# Patient Record
Sex: Female | Born: 1995
Health system: Southern US, Community
[De-identification: ages and names within clinical notes are randomized; demographics above are authoritative.]

## PROBLEM LIST (undated history)

## (undated) DIAGNOSIS — R55 Syncope and collapse: Secondary | ICD-10-CM

## (undated) DIAGNOSIS — I498 Other specified cardiac arrhythmias: Secondary | ICD-10-CM

## (undated) DIAGNOSIS — I951 Orthostatic hypotension: Secondary | ICD-10-CM

## (undated) HISTORY — DX: Syncope and collapse: R55

## (undated) HISTORY — DX: Orthostatic hypotension: I95.1

## (undated) HISTORY — PX: TONSILLECTOMY: SUR1361

## (undated) HISTORY — DX: Other specified cardiac arrhythmias: I49.8

---
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
## 2015-01-01 ENCOUNTER — Encounter (HOSPITAL_COMMUNITY): Payer: Self-pay | Admitting: *Deleted

## 2015-01-01 ENCOUNTER — Emergency Department (HOSPITAL_COMMUNITY)
Admission: EM | Admit: 2015-01-01 | Discharge: 2015-01-01 | Disposition: A | Discharge status: Home / Self Care | Payer: BLUE CROSS/BLUE SHIELD | Attending: Emergency Medicine | Admitting: Emergency Medicine

## 2015-01-01 ENCOUNTER — Emergency Department (HOSPITAL_COMMUNITY): Payer: BLUE CROSS/BLUE SHIELD

## 2015-01-01 DIAGNOSIS — S060X1A Concussion with loss of consciousness of 30 minutes or less, initial encounter: Secondary | ICD-10-CM

## 2015-01-01 DIAGNOSIS — S0990XA Unspecified injury of head, initial encounter: Secondary | ICD-10-CM | POA: Diagnosis present

## 2015-01-01 DIAGNOSIS — Y93E1 Activity, personal bathing and showering: Secondary | ICD-10-CM | POA: Insufficient documentation

## 2015-01-01 DIAGNOSIS — Y998 Other external cause status: Secondary | ICD-10-CM | POA: Insufficient documentation

## 2015-01-01 DIAGNOSIS — W182XXA Fall in (into) shower or empty bathtub, initial encounter: Secondary | ICD-10-CM | POA: Insufficient documentation

## 2015-01-01 DIAGNOSIS — Y92002 Bathroom of unspecified non-institutional (private) residence single-family (private) house as the place of occurrence of the external cause: Secondary | ICD-10-CM | POA: Insufficient documentation

## 2015-01-01 DIAGNOSIS — R011 Cardiac murmur, unspecified: Secondary | ICD-10-CM | POA: Diagnosis not present

## 2015-01-01 DIAGNOSIS — R55 Syncope and collapse: Secondary | ICD-10-CM | POA: Insufficient documentation

## 2015-01-01 DIAGNOSIS — Z3202 Encounter for pregnancy test, result negative: Secondary | ICD-10-CM | POA: Insufficient documentation

## 2015-01-01 DIAGNOSIS — Z792 Long term (current) use of antibiotics: Secondary | ICD-10-CM | POA: Diagnosis not present

## 2015-01-01 LAB — CBC
HEMATOCRIT: 39.5 % (ref 36.0–46.0)
Hemoglobin: 13.4 g/dL (ref 12.0–15.0)
MCH: 31.2 pg (ref 26.0–34.0)
MCHC: 33.9 g/dL (ref 30.0–36.0)
MCV: 91.9 fL (ref 78.0–100.0)
PLATELETS: 235 10*3/uL (ref 150–400)
RBC: 4.3 MIL/uL (ref 3.87–5.11)
RDW: 12.5 % (ref 11.5–15.5)
WBC: 5.9 10*3/uL (ref 4.0–10.5)

## 2015-01-01 LAB — URINALYSIS, ROUTINE W REFLEX MICROSCOPIC
BILIRUBIN URINE: NEGATIVE
GLUCOSE, UA: NEGATIVE mg/dL
KETONES UR: NEGATIVE mg/dL
NITRITE: NEGATIVE
PH: 6 (ref 5.0–8.0)
PROTEIN: NEGATIVE mg/dL
SPECIFIC GRAVITY, URINE: 1.029 (ref 1.005–1.030)
UROBILINOGEN UA: 0.2 mg/dL (ref 0.0–1.0)

## 2015-01-01 LAB — BASIC METABOLIC PANEL
ANION GAP: 9 (ref 5–15)
BUN: 15 mg/dL (ref 6–20)
CALCIUM: 9.3 mg/dL (ref 8.9–10.3)
CHLORIDE: 104 mmol/L (ref 101–111)
CO2: 25 mmol/L (ref 22–32)
Creatinine, Ser: 0.72 mg/dL (ref 0.44–1.00)
GFR calc Af Amer: >60 mL/min (ref >60)
GFR calc non Af Amer: >60 mL/min (ref >60)
GLUCOSE: 90 mg/dL (ref 65–99)
Potassium: 3.7 mmol/L (ref 3.5–5.1)
SODIUM: 138 mmol/L (ref 135–145)

## 2015-01-01 LAB — I-STAT BETA HCG BLOOD, ED (MC, WL, AP ONLY)

## 2015-01-01 LAB — URINE MICROSCOPIC-ADD ON

## 2015-01-01 MED ORDER — SODIUM CHLORIDE 0.9 % IV BOLUS (SEPSIS): 1000.0000 mL | Freq: Once | INTRAVENOUS | Status: DC

## 2015-01-01 NOTE — ED Notes (Signed)
Pt states she had a syncopal episode 2 days ago after taking a shower. Pt states she woke up with pain and a knot in the back of her head. Pt states she had syncopal episodes in the past but was never evaluated for them. Pt complains of dizziness, nausea, headache since the fall.

## 2015-01-01 NOTE — ED Notes (Signed)
Awake. Verbally responsive. A/O x4. Resp even and unlabored. No audible adventitious breath sounds noted. ABC's intact.  

## 2015-01-01 NOTE — ED Provider Notes (Signed)
CSN: [id number]     Arrival date & time 01/01/15  1310 History   First MD Initiated Contact with Patient 01/01/15 1402     Chief Complaint  Patient presents with  . Fall  . Head Injury  . Loss of Consciousness  . Dizziness     (Consider location/radiation/quality/duration/timing/severity/associated sxs/prior Treatment) HPI   Patient presents with residual headache after falling into the bathtub after syncopal episode two days ago.  States she was getting out of a hot shower, started feeling lightheaded and felt her heart start racing.  She then woke up in the bathtub with her father in the room with her.  States she remembers the sensation before passing out and remembers waking up, but doesn't remember actually taking the shower.  States she has had a throbbing headache since then that is intermittent, improved with ibuprofen.  She has had some lightheadedness with standing as well.  Denies fevers, neck pain or stiffness, other pain or injury, visual changes, vomiting, other focal neurologic deficits.  She has not at any time had any CP or SOB. Denies any other injury or concerns at this time. She takes doxycycline for acne but not other medications or medication changes.  Denies ETOH or drug use.  LMP last week, denies possibility of pregnancy.     History reviewed. No pertinent past medical history. Past Surgical History  Procedure Laterality Date  . Tonsillectomy     No family history on file. Social History  Substance Use Topics  . Smoking status: Never Smoker   . Smokeless tobacco: None  . Alcohol Use: No   OB History    No data available     Review of Systems  All other systems reviewed and are negative.     Allergies  Review of patient's allergies indicates not on file.  Home Medications   Prior to Admission medications   Medication Sig Start Date End Date Taking? Authorizing Provider  ciprofloxacin (CIPRO) 500 MG tablet Take 1 tablet (500 mg total) by mouth 2  (two) times daily. 08/05/12   Judyann Munson, MD   BP 141/64 mmHg  Pulse 106  Temp(Src) 98.6 F (37 C) (Oral)  Resp 18  SpO2 100%  LMP 12/25/2014 Physical Exam  Constitutional: She appears well-developed and well-nourished. No distress.  HENT:  Head: Normocephalic and atraumatic.  Eyes: Conjunctivae and EOM are normal. Pupils are equal, round, and reactive to light.  Neck: Normal range of motion. Neck supple.  Cardiovascular: Normal rate and regular rhythm.   Murmur (systolic) heard. Pulmonary/Chest: Effort normal and breath sounds normal. No respiratory distress. She has no wheezes. She has no rales.  Abdominal: Soft. She exhibits no distension. There is no tenderness. There is no rebound and no guarding.  Neurological: She is alert.  CN II-XII intact, EOMs intact, no pronator drift, grip strengths equal bilaterally; strength 5/5 in all extremities, sensation intact in all extremities; finger to nose, heel to shin, rapid alternating movements normal; gait is normal.     Skin: She is not diaphoretic.  Psychiatric: She has a normal mood and affect. Her behavior is normal. Thought content normal.  Nursing note and vitals reviewed.   ED Course  Procedures (including critical care time) Labs Review Labs Reviewed  URINALYSIS, ROUTINE W REFLEX MICROSCOPIC (NOT AT [facility]) - Abnormal; Notable for the following:    APPearance CLOUDY (*)    Hgb urine dipstick MODERATE (*)    Leukocytes, UA SMALL (*)    All other components  within normal limits  URINE MICROSCOPIC-ADD ON - Abnormal; Notable for the following:    Squamous Epithelial / LPF FEW (*)    Bacteria, UA FEW (*)    All other components within normal limits  BASIC METABOLIC PANEL  CBC  I-STAT BETA HCG BLOOD, ED ([facility], [facility], [facility] ONLY)    Imaging Review No results found. I, Carnisha Feltz, personally reviewed and evaluated these images and lab results as part of my medical decision-making.   EKG Interpretation   Date/Time:  Saturday  January 01 2015 13:40:49 EDT Ventricular Rate:  89 PR Interval:  132 QRS Duration: 100 QT Interval:  368 QTC Calculation: 448 R Axis:   66 Text Interpretation:  Sinus rhythm RSR' in V1 or V2, right VCD or RVH  Confirmed by ALLEN  MD, ANTHONY ([id number]) on 01/01/2015 3:37:30 PM       3:43 PM  Discussed pt with Dr Freida Busman who suggests pt should have a Head CT.  Will add head CT.  Patient and her mother agree to this.    MDM   Final diagnoses:  Syncope, unspecified syncope type  Concussion, with loss of consciousness of 30 minutes or less, initial encounter  Heart murmur    Afebrile, nontoxic patient with episode of syncope two days ago with memory loss around the event, also with intermittent headache since that time.  No neurologic deficits.  Workup remarkable only for noted murmur on physical exam that patient is unaware if she had previously.  Discussed with Dr Freida Busman who recommends Head CT and agrees with outpatient cardiology follow up.   Anticipate d/c home if Head CT negative.  Discussed pt with  Fayrene Helper, PA-C  Who assumes care pending final results.     Trixie Dredge, PA-C 01/01/15 1612  Lorre Nick, MD 01/02/15 0830

## 2015-01-01 NOTE — ED Provider Notes (Signed)
Physical Exam  BP 107/64 mmHg  Pulse 78  Temp(Src) 98.6 F (37 C) (Oral)  Resp 20  SpO2 100%  LMP 12/25/2014  Physical Exam  Constitutional: She is oriented to person, place, and time. She appears well-developed and well-nourished. No distress.  HENT:  Head: Normocephalic and atraumatic.  Eyes: Conjunctivae are normal.  Neck: Neck supple.  Neurological: She is alert and oriented to person, place, and time. She has normal strength. No cranial nerve deficit or sensory deficit. She displays a negative Romberg sign. Coordination and gait normal. GCS eye subscore is 4. GCS verbal subscore is 5. GCS motor subscore is 6.  Skin: No rash noted.  Psychiatric: She has a normal mood and affect.  Nursing note and vitals reviewed.   ED Course  Procedures  MDM Received signout at change of shift. Patient fell in the bathroom 2 days ago here for evaluation of headache. Head CT scan unremarkable. Patient has no focal neuro deficit on exam. Her pain is well controlled. She is able to ambulate without difficulty. She does have some symptoms of memory loss from the fall consistence with concussion. Concussion precautions discussed. UA shows blood and urine dipsticks likely secondary to recent menstrual period. She denies any urinary symptoms or having flank or abdominal pain. Patient stable for discharge.  BP 107/64 mmHg  Pulse 78  Temp(Src) 98.6 F (37 C) (Oral)  Resp 20  SpO2 100%  LMP 12/25/2014  I have reviewed nursing notes and vital signs. I personally viewed the imaging tests through PACS system and agrees with radiologist's intepretation I reviewed available ER/hospitalization records through the EMR  Results for orders placed or performed during the hospital encounter of 01/01/15  Basic metabolic panel  Result Value Ref Range   Sodium 138 135 - 145 mmol/L   Potassium 3.7 3.5 - 5.1 mmol/L   Chloride 104 101 - 111 mmol/L   CO2 25 22 - 32 mmol/L   Glucose, Bld 90 65 - 99 mg/dL   BUN 15 6 - 20 mg/dL   Creatinine, Ser 6.04 0.44 - 1.00 mg/dL   Calcium 9.3 8.9 - 54.0 mg/dL   GFR calc non Af Amer >60 >60 mL/min   GFR calc Af Amer >60 >60 mL/min   Anion gap 9 5 - 15  CBC  Result Value Ref Range   WBC 5.9 4.0 - 10.5 K/uL   RBC 4.30 3.87 - 5.11 MIL/uL   Hemoglobin 13.4 12.0 - 15.0 g/dL   HCT 98.1 19.1 - 47.8 %   MCV 91.9 78.0 - 100.0 fL   MCH 31.2 26.0 - 34.0 pg   MCHC 33.9 30.0 - 36.0 g/dL   RDW 29.5 62.1 - 30.8 %   Platelets 235 150 - 400 K/uL  Urinalysis, Routine w reflex microscopic (not at [facility])  Result Value Ref Range   Color, Urine YELLOW YELLOW   APPearance CLOUDY (A) CLEAR   Specific Gravity, Urine 1.029 1.005 - 1.030   pH 6.0 5.0 - 8.0   Glucose, UA NEGATIVE NEGATIVE mg/dL   Hgb urine dipstick MODERATE (A) NEGATIVE   Bilirubin Urine NEGATIVE NEGATIVE   Ketones, ur NEGATIVE NEGATIVE mg/dL   Protein, ur NEGATIVE NEGATIVE mg/dL   Urobilinogen, UA 0.2 0.0 - 1.0 mg/dL   Nitrite NEGATIVE NEGATIVE   Leukocytes, UA SMALL (A) NEGATIVE  Urine microscopic-add on  Result Value Ref Range   Squamous Epithelial / LPF FEW (A) RARE   WBC, UA 0-2 <3 WBC/hpf   RBC / HPF  0-2 <3 RBC/hpf   Bacteria, UA FEW (A) RARE   Urine-Other MUCOUS PRESENT   I-Stat Beta hCG blood, ED ([facility], [facility], [facility] only)  Result Value Ref Range   I-stat hCG, quantitative <5.0 <5 mIU/mL   Comment 3           Ct Head Wo Contrast  01/01/2015   CLINICAL DATA:  19 year old female with syncopal episode 2 days in the shower, awoke with a posterior head injury. Dizziness and nausea subsequent. Initial encounter.  EXAM: CT HEAD WITHOUT CONTRAST  TECHNIQUE: Contiguous axial images were obtained from the base of the skull through the vertex without intravenous contrast.  COMPARISON:  None.  FINDINGS: Visualized paranasal sinuses and mastoids are clear. Visualized orbit soft tissues are within normal limits. No scalp hematoma identified. Calvarium intact.  Cerebral volume is within normal limits. No midline  shift, ventriculomegaly, mass effect, evidence of mass lesion, intracranial hemorrhage or evidence of cortically based acute infarction. Gray-white matter differentiation is within normal limits throughout the brain. No suspicious intracranial vascular hyperdensity.  IMPRESSION: Normal non contrast appearance of the brain. No acute traumatic injury identified.   Electronically Signed   By: Odessa Fleming M.D.   On: 01/01/2015 16:15        Fayrene Helper, PA-C 01/01/15 1626  Donnetta Hutching, MD 01/02/15 [phone]

## 2015-01-01 NOTE — Discharge Instructions (Signed)
Read the information below.  You may return to the Emergency Department at any time for worsening condition or any new symptoms that concern you. If you develop chest pain, shortness of breath, fever, you pass out again, or become weak or dizzy, return to the ER for a recheck.     Syncope Syncope is a medical term for fainting or passing out. This means you lose consciousness and drop to the ground. People are generally unconscious for less than 5 minutes. You may have some muscle twitches for up to 15 seconds before waking up and returning to normal. Syncope occurs more often in older adults, but it can happen to anyone. While most causes of syncope are not dangerous, syncope can be a sign of a serious medical problem. It is important to seek medical care.  CAUSES  Syncope is caused by a sudden drop in blood flow to the brain. The specific cause is often not determined. Factors that can bring on syncope include:  Taking medicines that lower blood pressure.  Sudden changes in posture, such as standing up quickly.  Taking more medicine than prescribed.  Standing in one place for too long.  Seizure disorders.  Dehydration and excessive exposure to heat.  Low blood sugar (hypoglycemia).  Straining to have a bowel movement.  Heart disease, irregular heartbeat, or other circulatory problems.  Fear, emotional distress, seeing blood, or severe pain. SYMPTOMS  Right before fainting, you may:  Feel dizzy or light-headed.  Feel nauseous.  See all white or all black in your field of vision.  Have cold, clammy skin. DIAGNOSIS  Your health care provider will ask about your symptoms, perform a physical exam, and perform an electrocardiogram (ECG) to record the electrical activity of your heart. Your health care provider may also perform other heart or blood tests to determine the cause of your syncope which may include:  Transthoracic echocardiogram (TTE). During echocardiography, sound  waves are used to evaluate how blood flows through your heart.  Transesophageal echocardiogram (TEE).  Cardiac monitoring. This allows your health care provider to monitor your heart rate and rhythm in real time.  Holter monitor. This is a portable device that records your heartbeat and can help diagnose heart arrhythmias. It allows your health care provider to track your heart activity for several days, if needed.  Stress tests by exercise or by giving medicine that makes the heart beat faster. TREATMENT  In most cases, no treatment is needed. Depending on the cause of your syncope, your health care provider may recommend changing or stopping some of your medicines. HOME CARE INSTRUCTIONS  Have someone stay with you until you feel stable.  Do not drive, use machinery, or play sports until your health care provider says it is okay.  Keep all follow-up appointments as directed by your health care provider.  Lie down right away if you start feeling like you might faint. Breathe deeply and steadily. Wait until all the symptoms have passed.  Drink enough fluids to keep your urine clear or pale yellow.  If you are taking blood pressure or heart medicine, get up slowly and take several minutes to sit and then stand. This can reduce dizziness. SEEK IMMEDIATE MEDICAL CARE IF:   You have a severe headache.  You have unusual pain in the chest, abdomen, or back.  You are bleeding from your mouth or rectum, or you have black or tarry stool.  You have an irregular or very fast heartbeat.  You have pain  with breathing.  You have repeated fainting or seizure-like jerking during an episode.  You faint when sitting or lying down.  You have confusion.  You have trouble walking.  You have severe weakness.  You have vision problems. If you fainted, call your local emergency services (911 in [place] Do not drive yourself to the hospital.  MAKE SURE YOU:  Understand these  instructions.  Will watch your condition.  Will get help right away if you are not doing well or get worse. Document Released: 05/07/2005 Document Revised: 05/12/2013 Document Reviewed: 07/06/2011 [facility] Patient Information 2015 [place], [place]. This information is not intended to replace advice given to you by your health care provider. Make sure you discuss any questions you have with your health care provider.  Concussion A concussion, or closed-head injury, is a brain injury caused by a direct blow to the head or by a quick and sudden movement (jolt) of the head or neck. Concussions are usually not life-threatening. Even so, the effects of a concussion can be serious. If you have had a concussion before, you are more likely to experience concussion-like symptoms after a direct blow to the head.  CAUSES  Direct blow to the head, such as from running into another player during a soccer game, being hit in a fight, or hitting your head on a hard surface.  A jolt of the head or neck that causes the brain to move back and forth inside the skull, such as in a car crash. SIGNS AND SYMPTOMS The signs of a concussion can be hard to notice. Early on, they may be missed by you, family members, and health care providers. You may look fine but act or feel differently. Symptoms are usually temporary, but they may last for days, weeks, or even longer. Some symptoms may appear right away while others may not show up for hours or days. Every head injury is different. Symptoms include:  Mild to moderate headaches that will not go away.  A feeling of pressure inside your head.  Having more trouble than usual:  Learning or remembering things you have heard.  Answering questions.  Paying attention or concentrating.  Organizing daily tasks.  Making decisions and solving problems.  Slowness in thinking, acting or reacting, speaking, or reading.  Getting lost or being easily confused.  Feeling  tired all the time or lacking energy (fatigued).  Feeling drowsy.  Sleep disturbances.  Sleeping more than usual.  Sleeping less than usual.  Trouble falling asleep.  Trouble sleeping (insomnia).  Loss of balance or feeling lightheaded or dizzy.  Nausea or vomiting.  Numbness or tingling.  Increased sensitivity to:  Sounds.  Lights.  Distractions.  Vision problems or eyes that tire easily.  Diminished sense of taste or smell.  Ringing in the ears.  Mood changes such as feeling sad or anxious.  Becoming easily irritated or angry for little or no reason.  Lack of motivation.  Seeing or hearing things other people do not see or hear (hallucinations). DIAGNOSIS Your health care provider can usually diagnose a concussion based on a description of your injury and symptoms. He or she will ask whether you passed out (lost consciousness) and whether you are having trouble remembering events that happened right before and during your injury. Your evaluation might include:  A brain scan to look for signs of injury to the brain. Even if the test shows no injury, you may still have a concussion.  Blood tests to be sure other  problems are not present. TREATMENT  Concussions are usually treated in an emergency department, in urgent care, or at a clinic. You may need to stay in the hospital overnight for further treatment.  Tell your health care provider if you are taking any medicines, including prescription medicines, over-the-counter medicines, and natural remedies. Some medicines, such as blood thinners (anticoagulants) and aspirin, may increase the chance of complications. Also tell your health care provider whether you have had alcohol or are taking illegal drugs. This information may affect treatment.  Your health care provider will send you home with important instructions to follow.  How fast you will recover from a concussion depends on many factors. These factors  include how severe your concussion is, what part of your brain was injured, your age, and how healthy you were before the concussion.  Most people with mild injuries recover fully. Recovery can take time. In general, recovery is slower in older persons. Also, persons who have had a concussion in the past or have other medical problems may find that it takes longer to recover from their current injury. HOME CARE INSTRUCTIONS General Instructions  Carefully follow the directions your health care provider gave you.  Only take over-the-counter or prescription medicines for pain, discomfort, or fever as directed by your health care provider.  Take only those medicines that your health care provider has approved.  Do not drink alcohol until your health care provider says you are well enough to do so. Alcohol and certain other drugs may slow your recovery and can put you at risk of further injury.  If it is harder than usual to remember things, write them down.  If you are easily distracted, try to do one thing at a time. For example, do not try to watch TV while fixing dinner.  Talk with family members or close friends when making important decisions.  Keep all follow-up appointments. Repeated evaluation of your symptoms is recommended for your recovery.  Watch your symptoms and tell others to do the same. Complications sometimes occur after a concussion. Older adults with a brain injury may have a higher risk of serious complications, such as a blood clot on the brain.  Tell your teachers, school nurse, school counselor, coach, athletic trainer, or work Production designer, theatre/television/film about your injury, symptoms, and restrictions. Tell them about what you can or cannot do. They should watch for:  Increased problems with attention or concentration.  Increased difficulty remembering or learning new information.  Increased time needed to complete tasks or assignments.  Increased irritability or decreased ability to  cope with stress.  Increased symptoms.  Rest. Rest helps the brain to heal. Make sure you:  Get plenty of sleep at night. Avoid staying up late at night.  Keep the same bedtime hours on weekends and weekdays.  Rest during the day. Take daytime naps or rest breaks when you feel tired.  Limit activities that require a lot of thought or concentration. These include:  Doing homework or job-related work.  Watching TV.  Working on the computer.  Avoid any situation where there is potential for another head injury (football, hockey, soccer, basketball, martial arts, downhill snow sports and horseback riding). Your condition will get worse every time you experience a concussion. You should avoid these activities until you are evaluated by the appropriate follow-up health care providers. Returning To Your Regular Activities You will need to return to your normal activities slowly, not all at once. You must give your body and brain  enough time for recovery.  Do not return to sports or other athletic activities until your health care provider tells you it is safe to do so.  Ask your health care provider when you can drive, ride a bicycle, or operate heavy machinery. Your ability to react may be slower after a brain injury. Never do these activities if you are dizzy.  Ask your health care provider about when you can return to work or school. Preventing Another Concussion It is very important to avoid another brain injury, especially before you have recovered. In rare cases, another injury can lead to permanent brain damage, brain swelling, or death. The risk of this is greatest during the first 7-10 days after a head injury. Avoid injuries by:  Wearing a seat belt when riding in a car.  Drinking alcohol only in moderation.  Wearing a helmet when biking, skiing, skateboarding, skating, or doing similar activities.  Avoiding activities that could lead to a second concussion, such as contact  or recreational sports, until your health care provider says it is okay.  Taking safety measures in your home.  Remove clutter and tripping hazards from floors and stairways.  Use grab bars in bathrooms and handrails by stairs.  Place non-slip mats on floors and in bathtubs.  Improve lighting in dim areas. SEEK MEDICAL CARE IF:  You have increased problems paying attention or concentrating.  You have increased difficulty remembering or learning new information.  You need more time to complete tasks or assignments than before.  You have increased irritability or decreased ability to cope with stress.  You have more symptoms than before. Seek medical care if you have any of the following symptoms for more than 2 weeks after your injury:  Lasting (chronic) headaches.  Dizziness or balance problems.  Nausea.  Vision problems.  Increased sensitivity to noise or light.  Depression or mood swings.  Anxiety or irritability.  Memory problems.  Difficulty concentrating or paying attention.  Sleep problems.  Feeling tired all the time. SEEK IMMEDIATE MEDICAL CARE IF:  You have severe or worsening headaches. These may be a sign of a blood clot in the brain.  You have weakness (even if only in one hand, leg, or part of the face).  You have numbness.  You have decreased coordination.  You vomit repeatedly.  You have increased sleepiness.  One pupil is larger than the other.  You have convulsions.  You have slurred speech.  You have increased confusion. This may be a sign of a blood clot in the brain.  You have increased restlessness, agitation, or irritability.  You are unable to recognize people or places.  You have neck pain.  It is difficult to wake you up.  You have unusual behavior changes.  You lose consciousness. MAKE SURE YOU:  Understand these instructions.  Will watch your condition.  Will get help right away if you are not doing well or  get worse. Document Released: 07/28/2003 Document Revised: 05/12/2013 Document Reviewed: 11/27/2012 [facility] Patient Information 2015 [place], [place]. This information is not intended to replace advice given to you by your health care provider. Make sure you discuss any questions you have with your health care provider.  Heart Murmur A heart murmur is an extra sound heard by your health care provider when listening to your heart with a device called a stethoscope. The sound comes from turbulence when blood flows through the heart and may be a "hum" or "whoosh" sound heard when the heart  beats. There are two types of heart murmurs:  Innocent murmurs. Most people with this type of heart murmur do not have a heart problem. Many children have innocent heart murmurs. Your health care provider may suggest some basic testing to know whether your murmur is an innocent murmur. If an innocent heart murmur is found, there is no need for further tests or treatment and no need to restrict activities or stop playing sports.  Abnormal murmurs. These types of murmurs can occur in children and adults. In children, abnormal heart murmurs are typically caused from heart defects that are present at birth (congenital). In adults, abnormal murmurs are usually from heart valve problems caused by disease, infection, or aging. CAUSES  All heart murmurs are a result of an issue with your heart valves. Normally, these valves open to let blood flow through or out of your heart and then shut to keep it from flowing backward. If they do not work properly, you could have:  Regurgitation--When blood leaks back through the valve in the wrong direction.  Mitral valve prolapse--When the mitral valve of the heart has a loose flap and does not close tightly.  Stenosis--When the valve does not open enough and blocks blood flow. SIGNS AND SYMPTOMS  Innocent murmurs do not cause symptoms, and many people with abnormal murmurs may or may  not have symptoms. If symptoms do develop, they may include:  Shortness of breath.  Blue coloring of the skin, especially on the fingertips.  Chest pain.  Palpitations, or feeling a fluttering or skipped heartbeat.  Fainting.  Persistent cough.  Getting tired much faster than expected. DIAGNOSIS  A heart murmur might be heard during a sports physical or during any type of examination. When a murmur is heard, it may suggest a possible problem. When this happens, your health care provider may ask you to see a heart specialist (cardiologist). You may also be asked to have one or more heart tests. In these cases, testing may vary depending on what your health care provider heard. Tests for a heart murmur may include:  Electrocardiogram.  Echocardiogram.  MRI. For children and adults who have an abnormal heart murmur and want to play sports, it is important to complete testing, review test results, and receive recommendations from your health care provider. If heart disease is present, it may not be safe to play. TREATMENT  Innocent murmurs require no treatment or activity restriction. If an abnormal murmur represents a problem with the heart, treatment will depend on the exact nature of the problem. In these cases, medicine or surgery may be needed to treat the problem. HOME CARE INSTRUCTIONS If you want to participate in sports or other types of strenuous physical activity, it is important to discuss this first with your health care provider. If the murmur represents a problem with the heart and you choose to participate in sports, there is a small chance that a serious problem (including sudden death) could result.  SEEK MEDICAL CARE IF:   You feel that your symptoms are slowly worsening.  You develop any new symptoms that cause concern.  You feel that you are having side effects from any medicines prescribed. SEEK IMMEDIATE MEDICAL CARE IF:   You develop chest pain.  You have  shortness of breath.  You notice that your heart beats irregularly often enough to cause you to worry.  You have fainting spells.  Your symptoms suddenly get worse. Document Released: 06/14/2004 Document Revised: 05/12/2013 Document Reviewed: 01/12/2013 [employer]  Patient Information ©2015 [employer]. This information is not intended to replace advice given to you by your health care provider. Make sure you discuss any questions you have with your health care provider. ° °

## 2015-01-19 ENCOUNTER — Encounter: Payer: Self-pay | Admitting: Cardiovascular Disease

## 2015-01-19 ENCOUNTER — Ambulatory Visit (INDEPENDENT_AMBULATORY_CARE_PROVIDER_SITE_OTHER): Payer: BLUE CROSS/BLUE SHIELD | Admitting: Cardiovascular Disease

## 2015-01-19 VITALS — BP 104/62 | HR 86 | Ht 67.0 in | Wt 144.1 lb

## 2015-01-19 DIAGNOSIS — R55 Syncope and collapse: Secondary | ICD-10-CM | POA: Diagnosis not present

## 2015-01-19 DIAGNOSIS — R011 Cardiac murmur, unspecified: Secondary | ICD-10-CM

## 2015-01-19 DIAGNOSIS — Z87898 Personal history of other specified conditions: Secondary | ICD-10-CM | POA: Insufficient documentation

## 2015-01-19 NOTE — Patient Instructions (Signed)
Your physician has requested that you have an echocardiogram. Echocardiography is a painless test that uses sound waves to create images of your heart. It provides your doctor with information about the size and shape of your heart and how well your heart's chambers and valves are working. This procedure takes approximately one hour. There are no restrictions for this procedure.  Dr Duke Salvia has recommended that you follow-up with her as needed.  **Please discuss discontinuing Spironolactone with your dermatologist.

## 2015-01-19 NOTE — Progress Notes (Signed)
Cardiology Office Note   Date:  01/19/2015   ID:  Diane Hanson, DOB 1995-06-01, MRN [id number]  PCP:  No PCP Per Patient  Cardiologist:   Madilyn Hook, MD   Chief Complaint  Patient presents with  . New Evaluation    SOB when she walks long distances, gets dizzy when she stands up (had concussion few weeks ago)  . Loss of Consciousness  . Shortness of Breath      History of Present Illness: Diane Hanson is a 19 y.o. female who presents for an evaluation of syncope.  Diane Hanson was evaluated in the ED for syncope and headache on 01/01/15.  She had a syncopal event 2 days prior and presented to the ED with headache.  Head CT was unremarkable.  She was diagnosed with a concussion.  She was told that she had a murmur and should follow up with a cardiologist.  The syncopal event occurred after taking a hot shower.  After turning off the water she felt like her heart was racing and the next thing she remembers her head hit the back of the shower and she landed in the tub. She is unclear how long she was out.  Her father came up stairs when he heard the noise and she was waking up by the time he got there.  Since then she notices lightheadedness and dizziness when getting up from a seated position. She denies any chest pain, nausea, vomiting, lower extremity edema, orthopnea, or PND. She has however noted that she is more short of breath when walking across campus.  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  She specializes in vocal performance and opera.  Of note, Diane Hanson has been taking spironolactone for acne for 2 months.    No past medical history on file.  Past Surgical History  Procedure Laterality Date  . Tonsillectomy       Current Outpatient Prescriptions  Medication Sig Dispense Refill  . ACZONE 5 % topical gel Apply 1 application topically daily.    Marland Kitchen EPIDUO FORTE 0.3-2.5 % GEL Apply 1 application topically daily.    Marland Kitchen ibuprofen  (ADVIL,MOTRIN) 200 MG tablet Take 400 mg by mouth every 6 (six) hours as needed for moderate pain.    Marland Kitchen spironolactone (ALDACTONE) 50 MG tablet Take 50 mg by mouth daily.     No current facility-administered medications for this visit.    Allergies:   Review of patient's allergies indicates no known allergies.    Social History:  The patient  reports that she has never smoked. She does not have any smokeless tobacco history on file. She reports that she does not drink alcohol.   Family History:  The patient's family history is not on file.    ROS:  Please see the history of present illness.   Otherwise, review of systems are positive for none.   All other systems are reviewed and negative.    PHYSICAL EXAM: VS:  BP 104/62 mmHg  Pulse 86  Ht  (1.702 m)  Wt 65.363 kg (144 lb 1.6 oz)  BMI 22.56 kg/m2  LMP 12/25/2014 , BMI Body mass index is 22.56 kg/(m^2). GENERAL:  Well appearing HEENT:  Pupils equal round and reactive, fundi not visualized, oral mucosa unremarkable NECK:  No jugular venous distention, waveform within normal limits, carotid upstroke brisk and symmetric, no bruits, no thyromegaly LYMPHATICS:  No cervical adenopathy LUNGS:  Clear to auscultation bilaterally  HEART:  RRR.  PMI not displaced or sustained,S1 and S2 within normal limits, no S3, no S4, no clicks, no rubs, no murmurs ABD:  Flat, positive bowel sounds normal in frequency in pitch, no bruits, no rebound, no guarding, no midline pulsatile mass, no hepatomegaly, no splenomegaly EXT:  2 plus pulses throughout, no edema, no cyanosis no clubbing SKIN:  No rashes no nodules NEURO:  Cranial nerves II through XII grossly intact, motor grossly intact throughout PSYCH:  Cognitively intact, oriented to person place and time    EKG:  EKG is ordered today. The ekg ordered today demonstrates sinus arrhythmia at 86 bpm.   Recent Labs: 01/01/2015: BUN 15; Creatinine, Ser 0.72; Hemoglobin 13.4; Platelets 235;  Potassium 3.7; Sodium 138    Lipid Panel No results found for: CHOL, TRIG, HDL, CHOLHDL, VLDL, LDLCALC, LDLDIRECT    Wt Readings from Last 3 Encounters:  01/19/15 65.363 kg (144 lb 1.6 oz) (74 %*, Z = 0.65)   * Growth percentiles are based on CDC 2-20 Years data.      Other studies Reviewed: Additional studies/ records that were reviewed today include: . Review of the above records demonstrates:  Please see elsewhere in the note.     ASSESSMENT AND PLAN:  # Syncope: Diane Hanson syncopal episode occurred after a hot shower and while taking spironolactone.  I suspect that this was an episode of orthostasis.  She is not frankly orthostatic in length today but was symptomatic when she went from lying to sitting. I suggested that she consider stopping spironolactone, as it may be contributing to her symptoms.   - consider stopping spironolactone - increase fluid and salt intake - elevate legs - compression stockings.  # Shortness of breath: I do not hear a murmur on exam and she does not have any physical exam findings of heart failure.  Will obtain an echo to ensure that she does not have any significant structural disease. - echo    Current medicines are reviewed at length with the patient today.  The patient does not have concerns regarding medicines.  The following changes have been made:  Consider stopping spironolactone.  Labs/ tests ordered today include:   Orders Placed This Encounter  Procedures  . EKG 12-Lead  . ECHOCARDIOGRAM COMPLETE     Disposition:   FU with Dr. Elmarie Shiley C. Duke Salvia prn   Signed, Madilyn Hook, MD  01/19/2015 11:40 AM    [facility]

## 2015-01-20 ENCOUNTER — Ambulatory Visit: Payer: BLUE CROSS/BLUE SHIELD | Admitting: Cardiovascular Disease

## 2015-02-01 ENCOUNTER — Other Ambulatory Visit: Payer: Self-pay

## 2015-02-01 ENCOUNTER — Ambulatory Visit (INDEPENDENT_AMBULATORY_CARE_PROVIDER_SITE_OTHER): Payer: BLUE CROSS/BLUE SHIELD

## 2015-02-01 ENCOUNTER — Telehealth: Payer: Self-pay | Admitting: Cardiovascular Disease

## 2015-02-01 ENCOUNTER — Ambulatory Visit (HOSPITAL_COMMUNITY): Payer: BLUE CROSS/BLUE SHIELD | Attending: Cardiology

## 2015-02-01 DIAGNOSIS — R011 Cardiac murmur, unspecified: Secondary | ICD-10-CM

## 2015-02-01 DIAGNOSIS — R55 Syncope and collapse: Secondary | ICD-10-CM

## 2015-02-01 NOTE — Telephone Encounter (Addendum)
Received call from Will, echo tech at [employer]. Reports in performing echocardiogram today, concern for questionable AFib/arrythmia - couldn't prove this, but noted recurrent syncope,   noted NSR on EKG performed in our office when seen by Dr. Duke Salvia - ?Consider possibility of PAF.  Discussed w/ Jasmine December, she informed me Dr. Duke Salvia likely fine w/ monitor, recommended 14-day order. Returned call to Will, he voiced thanks for response on this - he will discuss w/ monitor team at [facility] to get patient monitor placement done today.

## 2015-02-01 NOTE — Telephone Encounter (Signed)
Agree with event monitoring.  Thank you.

## 2015-02-08 ENCOUNTER — Telehealth: Payer: Self-pay | Admitting: *Deleted

## 2015-02-08 NOTE — Telephone Encounter (Signed)
Spoke to patient. Result given . Verbalized understanding  

## 2015-02-08 NOTE — Telephone Encounter (Signed)
-----   Message from Chilton Si, MD sent at 02/04/2015 11:11 PM EDT ----- Normal echo.

## 2015-02-08 NOTE — Telephone Encounter (Signed)
Left message to cal back 

## 2015-02-14 ENCOUNTER — Telehealth: Payer: Self-pay | Admitting: Cardiovascular Disease

## 2015-02-14 NOTE — Telephone Encounter (Signed)
Fax from [employer] received. Reviewed and signed by Dr. Duke Salvia.  Left message for patient at cell phone number listed on [employer] fax sheet.  ?if any symptoms at time of event or if accidental push

## 2015-02-14 NOTE — Telephone Encounter (Signed)
Rhythm reveals sinus tachycardia in the 160s.  The monitor was reportedly triggered on accident.  Salley Slaughter, RN continues to try to reach the patient.

## 2015-02-14 NOTE — Telephone Encounter (Signed)
Incoming call from [employer] monitoring services.  Alert of patient-reported event yesterday. Sinus tach at high rate of 165. Fax #confirmed, awaiting strip - will review w Dr. Duke Salvia.  [employer] will attempt to reach out to patient for further information.

## 2015-02-14 NOTE — Telephone Encounter (Signed)
Patient returned my call.  She states she pushed today, had symptom of SOB, heart racing, lightheaded. She notes at the time she was walking from dorm to her 1st morning class (around 9am).  She denies syncope today. She denies new symptoms, notes this has been the cluster of symptoms she's had preceding syncope/near syncope.  F/u visit 10/5 for monitor results.  Routing to Dr. Duke Salvia.

## 2015-02-14 NOTE — Telephone Encounter (Signed)
Called patient again, left message.  Called [employer], re-requested fax to be sent to our office. Verified number.

## 2015-02-22 ENCOUNTER — Telehealth: Payer: Self-pay | Admitting: *Deleted

## 2015-02-22 NOTE — Telephone Encounter (Signed)
-----   Message from Chilton Si, MD sent at 02/21/2015 11:04 PM EDT ----- No dangerous heart rhythms were noted.  It is unclear whether her symptoms were related to her heart rhythm.  Please schedule for follow up appointment first available but no need to overbook.

## 2015-02-22 NOTE — Telephone Encounter (Signed)
Spoke to patient. Result given . Verbalized understanding Appointment 02/23/15

## 2015-02-23 ENCOUNTER — Ambulatory Visit (INDEPENDENT_AMBULATORY_CARE_PROVIDER_SITE_OTHER): Payer: BLUE CROSS/BLUE SHIELD | Admitting: Cardiovascular Disease

## 2015-02-23 ENCOUNTER — Encounter: Payer: Self-pay | Admitting: *Deleted

## 2015-02-23 ENCOUNTER — Encounter: Payer: Self-pay | Admitting: Cardiovascular Disease

## 2015-02-23 VITALS — BP 114/74 | HR 74 | Ht 67.0 in | Wt 145.0 lb

## 2015-02-23 DIAGNOSIS — I498 Other specified cardiac arrhythmias: Secondary | ICD-10-CM

## 2015-02-23 DIAGNOSIS — R002 Palpitations: Secondary | ICD-10-CM

## 2015-02-23 HISTORY — DX: Other specified cardiac arrhythmias: I49.8

## 2015-02-23 MED ORDER — PROPRANOLOL HCL 10 MG PO TABS: 10.0000 mg | ORAL_TABLET | Freq: Three times a day (TID) | ORAL | Status: DC

## 2015-02-23 NOTE — Patient Instructions (Signed)
LABS -TSH TODAY  Start PROPRANOLOL 10 MG ONE TABLET THREE TIMES A DAY.  Your physician wants you to follow-up in 6 MONTHS WITH DR [facility]. You will receive a reminder letter in the mail two months in advance. If you don't receive a letter, please call our office to schedule the follow-up appointment.

## 2015-02-23 NOTE — Progress Notes (Signed)
Cardiology Office Note   Date:  02/23/2015   ID:  Diane Hanson, DOB August 20, 1995, MRN [id number]  PCP:  No PCP Per Patient  Cardiologist:   Madilyn Hook, MD   Chief Complaint  Patient presents with  . Appointment    monitor f/u. has  had some palpitations with some shob. no other complaints      History of Present Illness: Diane Hanson is a 19 y.o. female who presents for follow up on her event monitor.  Diane Hanson was seen in clinic on 8/31 for an evaluation of syncope.  At that appointment she was noted to be on spironolactone for acne, which she was asked to stop due to concern for orthostasis.  She was referred for echo due to a heart murmur, which was unremarkable.  She also had a 14 day event monitor that showed sinus rhythm, sinus arrhythmia and sinus tachycardia.  She reported one event of heart fluttering and skipped beats, at which time her rhythm was sinus arrhythmia and sinus tachycardia.  She also reported chest pain and shortness of breath when her heart rhythm was 161 bpm.   Diane Hanson reports stopping spironolactone as instructed.  She continues to note episodes of dizziness especially with exertion.  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   Past Medical History  Diagnosis Date  . Syncope     Past Surgical History  Procedure Laterality Date  . Tonsillectomy       Current Outpatient Prescriptions  Medication Sig Dispense Refill  . ACZONE 5 % topical gel Apply 1 application topically daily.    Marland Kitchen doxycycline (VIBRA-TABS) 100 MG tablet Take 1 tablet by mouth 2 (two) times daily.    Marland Kitchen EPIDUO FORTE 0.3-2.5 % GEL Apply 1 application topically daily.    Marland Kitchen ibuprofen (ADVIL,MOTRIN) 200 MG tablet Take 400 mg by mouth every 6 (six) hours as needed for moderate pain.    Marland Kitchen propranolol (INDERAL) 10 MG tablet Take 1 tablet (10 mg total) by  mouth 3 (three) times daily. 90 tablet 11   No current facility-administered medications for this visit.    Allergies:   Review of patient's allergies indicates no known allergies.    Social History:  The patient  reports that she has never smoked. She does not have any smokeless tobacco history on file. She reports that she does not drink alcohol.   Family History:  The patient's family history is not on file.    ROS:  Please see the history of present illness.   Otherwise, review of systems are positive for none.   All other systems are reviewed and negative.    PHYSICAL EXAM: VS:  BP 114/74 mmHg  Pulse 74  Ht  (1.702 m)  Wt 65.772 kg (145 lb)  BMI 22.71 kg/m2  SpO2 99% , BMI Body mass index is 22.71 kg/(m^2). GENERAL:  Well appearing HEENT:  Pupils equal round and reactive, fundi not visualized, oral mucosa unremarkable NECK:  No jugular venous distention, waveform within normal limits, carotid upstroke brisk and symmetric, no bruits, no thyromegaly LYMPHATICS:  No cervical adenopathy LUNGS:  Clear to auscultation bilaterally HEART:  RRR.  PMI not displaced or sustained,S1 and S2 within normal limits, no S3, no S4, no clicks, no rubs, no murmurs ABD:  Flat, positive bowel sounds normal in frequency in pitch,  no bruits, no rebound, no guarding, no midline pulsatile mass, no hepatomegaly, no splenomegaly EXT:  2 plus pulses throughout, no edema, no cyanosis no clubbing SKIN:  No rashes no nodules NEURO:  Cranial nerves II through XII grossly intact, motor grossly intact throughout PSYCH:  Cognitively intact, oriented to person place and time    EKG:  EKG is not ordered today.  Recent Labs: 01/01/2015: BUN 15; Creatinine, Ser 0.72; Hemoglobin 13.4; Platelets 235; Potassium 3.7; Sodium 138    Lipid Panel No results found for: CHOL, TRIG, HDL, CHOLHDL, VLDL, LDLCALC, LDLDIRECT    Wt Readings from Last 3 Encounters:  02/23/15 65.772 kg (145 lb) (75 %*, Z = 0.67)    01/19/15 65.363 kg (144 lb 1.6 oz) (74 %*, Z = 0.65)   * Growth percentiles are based on CDC 2-20 Years data.    Echo 02/01/15: Study Conclusions  - Left ventricle: The cavity size was normal. Wall thickness was normal. Systolic function was normal. The estimated ejection fraction was in the range of 55% to 60%. Wall motion was normal; there were no regional wall motion abnormalities. - Pulmonary arteries: Systolic pressure was mildly increased. PA peak pressure: 31 mm Hg (S).  14 Day Event Monitor 02/01/15:  Quality: Fair. Baseline artifact. Rhythms Recorded: sinus rhythm, sinus arrhythmia and sinus tachycardia There were no PACs or PVCs There were no pauses >2.5s  Patient did submit a symptom diary. At the time when heart fluttering and skipped beats were reported, the rhythm was sinus arrhythmia and sinus tachycardia, rate 123 bpm. She also reported chest pain and shortness of breath. At that time sinus tachycardia was noted at 161 bpm.  Other studies Reviewed: Additional studies/ records that were reviewed today include: . Review of the above records demonstrates:  Please see elsewhere in the note.     ASSESSMENT AND PLAN:  # Sinus arrhythmia: Diane Hanson has frequent episodes of sinus arrhytjmia which are likely causing her palpitations and dizziness.  Her heart rate varies from 60 to 120 beats per minute from beat to beat.  - Propranolol  tid - Check TSH  # Orthostatic hypotension: Diane Hanson was not orthostatic by vital signs at her last appointment.  However, she was symptomatic with positional changes. We have recommended that she increase her fluid intake and not restrict salt intake. We've also recommended that she wear compression socks or stockings. If she has further episodes of syncope will consider tilt table testing.   Current medicines are reviewed at length with the patient today.  The patient does not have concerns regarding  medicines.  Labs/ tests ordered today include:   Orders Placed This Encounter  Procedures  . TSH     Disposition:   FU with Dr. Elmarie Shiley C. Lincoln in 6 months.   Signed, Madilyn Hook, MD  02/23/2015 10:38 AM    [facility]

## 2015-03-01 LAB — TSH: TSH: 1.153 u[IU]/mL (ref 0.350–4.500)

## 2015-03-03 ENCOUNTER — Telehealth: Payer: Self-pay | Admitting: *Deleted

## 2015-03-03 NOTE — Telephone Encounter (Signed)
Spoke to patient. Result given . Verbalized understanding  

## 2015-03-03 NOTE — Telephone Encounter (Signed)
-----   Message from Chilton Siiffany Williams, MD sent at 03/02/2015  7:06 PM EDT ----- Normal thyroid studies.

## 2015-05-02 ENCOUNTER — Ambulatory Visit (INDEPENDENT_AMBULATORY_CARE_PROVIDER_SITE_OTHER): Payer: BLUE CROSS/BLUE SHIELD | Admitting: Physician Assistant

## 2015-05-02 ENCOUNTER — Telehealth: Payer: Self-pay | Admitting: Cardiovascular Disease

## 2015-05-02 ENCOUNTER — Encounter: Payer: Self-pay | Admitting: Physician Assistant

## 2015-05-02 VITALS — BP 106/74 | HR 49 | Ht 67.0 in | Wt 151.2 lb

## 2015-05-02 DIAGNOSIS — R002 Palpitations: Secondary | ICD-10-CM

## 2015-05-02 DIAGNOSIS — I498 Other specified cardiac arrhythmias: Secondary | ICD-10-CM | POA: Diagnosis not present

## 2015-05-02 NOTE — Telephone Encounter (Signed)
Pr have been having episodes all week-end.She have been lightheaded,heart fluttering and headaches.

## 2015-05-02 NOTE — Telephone Encounter (Signed)
Pt of Dr. Duke Salviaandolph. Seen in October and wore monitor for eval of palps, near-syncope at that time.  Has dx of sinus arrythmia.  She reports heart "fluttering" on and off past 3-4 days, SOB w/ exertion. She is taking inderal and other meds as prescribed. Notes lightheadedness & headaches accompany her symptoms over the weekend. She denies syncope. Asked if she would want to be seen today & has driver. Pt expressed desire for OV. Scheduled for 3pm and advised to call in meantime if any significant changes.

## 2015-05-02 NOTE — Progress Notes (Signed)
Patient ID: Diane Hanson, female   DOB: Feb 27, 1996, 19 y.o.   MRN: [id number]    Date:  05/02/2015   ID:  Diane Hanson, DOB 1996/04/30, MRN [id number]  PCP:  No PCP Per Patient  Primary Cardiologist:  Duke Salvia   Chief Complaint  Patient presents with  . Follow-up    pt states she has been dizzy  . Chest Pain    pt states she feel some tightness  . Shortness of Breath    only when doing things  . Edema    no edema      History of Present Illness: Diane Hanson is a 19 y.o. female who was seen in clinic on 8/31 for an evaluation of syncope. At that appointment she was noted to be on spironolactone for acne, which she was asked to stop due to concern for orthostasis. She was referred for echo due to a heart murmur, which was unremarkable. She also had a 14 day event monitor that showed sinus rhythm, sinus arrhythmia and sinus tachycardia. She reported one event of heart fluttering and skipped beats, at which time her rhythm was sinus arrhythmia and sinus tachycardia. She also reported chest pain and shortness of breath when her heart rhythm was 161 bpm.   She was seen in Oct. at that time she continued to note episodes of dizziness especially with exertion. 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  Dr. Duke Salvia started her on 10 mg of propranolol 3 times daily.  TSH was checked and within normal limits.  Patient presents today with complaints of increasing palpitations last week. Yesterday was a particular symptomatic day and she was repeatedly sitting and standing during a singing performance/practice. She was getting dizzy with some shortness of breath, chest pain. She also reports sometimes occurs with sleeping. She is under a lot of stress and agrees there may be some anxiety associated with it.    The patient currently denies nausea, vomiting, fever, orthopnea, PND, cough, congestion, abdominal pain,  hematochezia, melena, lower extremity edema, claudication.  Wt Readings from Last 3 Encounters:  05/02/15 151 lb 3.2 oz (68.584 kg)  02/23/15 145 lb (65.772 kg) (75 %*, Z = 0.67)  01/19/15 144 lb 1.6 oz (65.363 kg) (74 %*, Z = 0.65)   * Growth percentiles are based on CDC 2-20 Years data.     Past Medical History  Diagnosis Date  . Syncope   . Sinus arrhythmia 02/23/2015    Current Outpatient Prescriptions  Medication Sig Dispense Refill  . Doxycycline Hyclate (ACTICLATE PO) Take 1 tablet by mouth daily.    Marland Kitchen ibuprofen (ADVIL,MOTRIN) 200 MG tablet Take 400 mg by mouth every 6 (six) hours as needed for moderate pain.    Marland Kitchen propranolol (INDERAL) 10 MG tablet Take 1 tablet (10 mg total) by mouth 3 (three) times daily. 90 tablet 11   No current facility-administered medications for this visit.    Allergies:   No Known Allergies  Social History:  The patient  reports that she has never smoked. She does not have any smokeless tobacco history on file. She reports that she does not drink alcohol.   Family history:  No family history on file.  ROS:  Please see the history of present illness.  All other systems reviewed and negative.   PHYSICAL EXAM: VS:  BP 106/74 mmHg  Pulse 49  Ht  (1.702 m)  Wt 151 lb  3.2 oz (68.584 kg)  BMI 23.68 kg/m2 Well nourished, well developed, in no acute distress HEENT: Pupils are equal round react to light accommodation extraocular movements are intact.  Neck: no JVDNo cervical lymphadenopathy. Cardiac: Regular rate and rhythm without murmurs rubs or gallops. Lungs:  clear to auscultation bilaterally, no wheezing, rhonchi or rales Abd: soft, nontender, positive bowel sounds all quadrants, no hepatosplenomegaly Ext: no lower extremity edema.  2+ radial and dorsalis pedis pulses. Skin: warm and dry Neuro:  Grossly normal  EKG:  Sinus bradycardia with sinus arrhythmia rate 49 bpm   ASSESSMENT AND PLAN:  Problem List Items Addressed This Visit     Sinus arrhythmia   Palpitations    Other Visit Diagnoses    Heart palpitations    -  Primary    Relevant Orders    EKG 12-Lead      Patient presented again with complaints of elevated heart rate, palpitations dizziness with position change. Seemingly get worse last week and particularly yesterday when she was repeatedly standing and sitting during a performance.  We checked orthostatic vitals and they were normal.  There was no exaggerated elevation in HR to suggest POTS.  Her initial blood pressure was 106/74.  She has not been wearing compression socks as recommended previously.  Suggested she get them at least wear them during the practice/performances. Also recommended she increase her sodium intake a little bit to be 2500 mg daily in order to give her a little more blood pressure.  She has been compliant with medications.There is indeed an anxiety component or stress-related I suggested some meditation/yoga.  At this time would not increase her propranolol given her blood pressure was on the low side and she is also bradycardic at 49 bpm.

## 2015-05-02 NOTE — Patient Instructions (Signed)
1.) Judie GrieveBryan recommends that when you are singing that you wear compression socks.  2.) also increase your sodium (salt) intake approxmiately 2500 mg daily.  Your physician recommends that you schedule a follow-up appointment in: 2 months with Dr Duke Salviaandolph.

## 2015-07-10 NOTE — Progress Notes (Addendum)
Cardiology Office Note   Date:  07/11/2015   ID:  Diane Hanson, DOB 1995/09/06, MRN [id number]  PCP:  No PCP Per Patient  Cardiologist:   Madilyn Hook, MD   Chief Complaint  Patient presents with  . Follow-up    Still feeling palpatations, and feeling dizzy when she stands up    Patient ID: Diane Hanson is a 20 y.o. female with syncope who presents for follow up.  Interval History 07/11/15: At her last appointment Diane Hanson was started on propranolol 10 mg tid.  She was also instructed to increase her fluid and salt intake.  Thyroid function was normal.  She continued to have episodes of palpitations and was seen by Jones Skene, PA-C on 12/12.  She was not orthostatic at that appointment.  She was instructed to wear compression stockings and increase her sodium intake to at least 2500 mg.  There was also concern that anxiety and stress may be contributing.  Since then she has been feeling dizzy when she stands and has been feeling palpitaitions.  Twice she felt like she was going to pass out.  Her vision feels like it is going black. This sometimes happens when getting out of shower.  She denies syncope.  She has increased her fluid intake to roughly 72 oz of fluid daily.  She has also worn compression stockings without improvement.  She has some dizziness daily but had 2 episodes of near syncope.  She also feels lightheaded when working out at [employer]  She also notes some palpitations when working out at the gym.  [name] is planning to travel abroad to [place] for 1 month this summer.  History of Present Illness 02/23/15:  Diane Hanson was seen in clinic on 8/31 for an evaluation of syncope.  At that appointment she was noted to be on spironolactone for acne, which she was asked to stop due to concern for orthostasis.  She was referred for echo due to a heart murmur, which was unremarkable.  She also had a 14 day event monitor that showed sinus rhythm, sinus arrhythmia and  sinus tachycardia.  She reported one event of heart fluttering and skipped beats, at which time her rhythm was sinus arrhythmia and sinus tachycardia.  She also reported chest pain and shortness of breath when her heart rhythm was 161 bpm.   Diane Hanson reports stopping spironolactone as instructed.  She continues to note episodes of dizziness especially with exertion.  She is wondering if she is safe to resume exercise.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   Past Medical History  Diagnosis Date  . Syncope   . Sinus arrhythmia 02/23/2015  . Orthostatic hypotension 07/11/2015    Past Surgical History  Procedure Laterality Date  . Tonsillectomy       Current Outpatient Prescriptions  Medication Sig Dispense Refill  . Doxycycline Hyclate (ACTICLATE) 150 MG TABS Take by mouth once.    Marland Kitchen ibuprofen (ADVIL,MOTRIN) 200 MG tablet Take 400 mg by mouth every 6 (six) hours as needed for moderate pain.    . fludrocortisone (FLORINEF) 0.1 MG tablet Take 1 tablet (0.1 mg total) by mouth daily. 30 tablet 6   No current facility-administered medications for this visit.    Allergies:   Review of patient's allergies indicates no known allergies.    Social History:  The patient  reports that she has never smoked. She does  not have any smokeless tobacco history on file. She reports that she does not drink alcohol.   Family History:  The patient's family history is not on file.    ROS:  Please see the history of present illness.   Otherwise, review of systems are positive for none.   All other systems are reviewed and negative.    PHYSICAL EXAM: VS:  BP 94/70 mmHg  Pulse 60  Ht  (1.702 m)  Wt 64.411 kg (142 lb)  BMI 22.24 kg/m2  LMP 06/27/2015 , BMI Body mass index is 22.24 kg/(m^2). GENERAL:  Well appearing HEENT:  Pupils equal round and reactive, fundi not visualized, oral mucosa unremarkable NECK:  No jugular  venous distention, waveform within normal limits, carotid upstroke brisk and symmetric, no bruits, no thyromegaly LYMPHATICS:  No cervical adenopathy LUNGS:  Clear to auscultation bilaterally HEART:  RRR.  PMI not displaced or sustained,S1 and S2 within normal limits, no S3, no S4, no clicks, no rubs, no murmurs ABD:  Flat, positive bowel sounds normal in frequency in pitch, no bruits, no rebound, no guarding, no midline pulsatile mass, no hepatomegaly, no splenomegaly EXT:  2 plus pulses throughout, no edema, no cyanosis no clubbing SKIN:  No rashes no nodules NEURO:  Cranial nerves II through XII grossly intact, motor grossly intact throughout PSYCH:  Cognitively intact, oriented to person place and time   EKG:  EKG is ordered today.  Sinus arrhythmia rate 58 BPM.  Recent Labs: 01/01/2015: BUN 15; Creatinine, Ser 0.72; Hemoglobin 13.4; Platelets 235; Potassium 3.7; Sodium 138 02/28/2015: TSH 1.153    Lipid Panel No results found for: CHOL, TRIG, HDL, CHOLHDL, VLDL, LDLCALC, LDLDIRECT    Wt Readings from Last 3 Encounters:  07/11/15 64.411 kg (142 lb)  05/02/15 68.584 kg (151 lb 3.2 oz)  02/23/15 65.772 kg (145 lb) (75 %*, Z = 0.67)   * Growth percentiles are based on CDC 2-20 Years data.    Echo 02/01/15: Study Conclusions  - Left ventricle: The cavity size was normal. Wall thickness was normal. Systolic function was normal. The estimated ejection fraction was in the range of 55% to 60%. Wall motion was normal; there were no regional wall motion abnormalities. - Pulmonary arteries: Systolic pressure was mildly increased. PA peak pressure: 31 mm Hg (S).  14 Day Event Monitor 02/01/15:  Quality: Fair. Baseline artifact. Rhythms Recorded: sinus rhythm, sinus arrhythmia and sinus tachycardia There were no PACs or PVCs There were no pauses >2.5s  Patient did submit a symptom diary. At the time when heart fluttering and skipped beats were reported, the rhythm was  sinus arrhythmia and sinus tachycardia, rate 123 bpm. She also reported chest pain and shortness of breath. At that time sinus tachycardia was noted at 161 bpm.  Other studies Reviewed: Additional studies/ records that were reviewed today include: . Review of the above records demonstrates:  Please see elsewhere in the note.     ASSESSMENT AND PLAN:  # Orthostatic hypotension: Ms. Kolker was not orthostatic by vital signs at her last appointment.  However, she was symptomatic with positional changes. She has not responded to conservative therapy.  We will start florinef 0.1mg  daily.  Repeat BMP in 1 week.  # Sinus arrhythmia: Ms. Wendell has frequent episodes of sinus arrhytjmia which are likely causing her palpitations and dizziness.  Her heart rate varies from 60 to 120 beats per minute from beat to beat.  Given her hypotension we will stop the propranolol.  If  she develops worsening palpitations we may try metoprolol instead.   Current medicines are reviewed at length with the patient today.  The patient does not have concerns regarding medicines.  Labs/ tests ordered today include:   Orders Placed This Encounter  Procedures  . Basic metabolic panel  . EKG 12-Lead     Disposition:   FU with Dr. Elmarie Shiley C. Bergman in 1 month.   Signed, Madilyn Hook, MD  07/11/2015 2:27 PM    [facility]

## 2015-07-11 ENCOUNTER — Encounter: Payer: Self-pay | Admitting: Cardiovascular Disease

## 2015-07-11 ENCOUNTER — Ambulatory Visit (INDEPENDENT_AMBULATORY_CARE_PROVIDER_SITE_OTHER): Payer: BLUE CROSS/BLUE SHIELD | Admitting: Cardiovascular Disease

## 2015-07-11 VITALS — BP 94/70 | HR 60 | Ht 67.0 in | Wt 142.0 lb

## 2015-07-11 DIAGNOSIS — I951 Orthostatic hypotension: Secondary | ICD-10-CM | POA: Diagnosis not present

## 2015-07-11 DIAGNOSIS — I498 Other specified cardiac arrhythmias: Secondary | ICD-10-CM | POA: Diagnosis not present

## 2015-07-11 DIAGNOSIS — Z79899 Other long term (current) drug therapy: Secondary | ICD-10-CM | POA: Diagnosis not present

## 2015-07-11 DIAGNOSIS — R002 Palpitations: Secondary | ICD-10-CM | POA: Diagnosis not present

## 2015-07-11 HISTORY — DX: Orthostatic hypotension: I95.1

## 2015-07-11 MED ORDER — FLUDROCORTISONE ACETATE 0.1 MG PO TABS: 0.1000 mg | ORAL_TABLET | Freq: Every day | ORAL | Status: DC

## 2015-07-11 NOTE — Patient Instructions (Addendum)
STOP PROPANOLOL  START FLORINEF 0.1MG  1 TABLET  BY MOUTH DAILY.  IN 7 DAYS - PLEASE HAVE LAB DRAWN--BMP   Your physician recommends that you schedule a follow-up appointment in 1 MONTH WITH DR Grayland.

## 2015-07-26 LAB — BASIC METABOLIC PANEL
BUN: 13 mg/dL (ref 7–25)
CALCIUM: 9.2 mg/dL (ref 8.6–10.2)
CHLORIDE: 103 mmol/L (ref 98–110)
CO2: 28 mmol/L (ref 20–31)
CREATININE: 0.72 mg/dL (ref 0.50–1.10)
GLUCOSE: 94 mg/dL (ref 65–99)
POTASSIUM: 4.3 mmol/L (ref 3.5–5.3)
SODIUM: 139 mmol/L (ref 135–146)

## 2015-08-16 NOTE — Progress Notes (Signed)
Cardiology Office Note   Date:  08/17/2015   ID:  Diane Hanson, DOB [birth year], MRN [id number]  PCP:  No PCP Per Patient  Cardiologist:   Madilyn Hook, MD   Chief Complaint  Patient presents with  . Follow-up    1 month  pt c/o dizziness--not as bad as before, happens occasionally when she stands up; 2 nights ago and last night--felt her heart race, room started spinning, hands shaking    Patient ID: Diane Hanson is a 20 y.o. female with syncope who presents for follow up.  Interval History 08/17/15: At her last appointment Diane Hanson had persistent symptoms despite conservative therapy so she was started on florinef 0.1 mg daily.  Propranolol was stopped due to hypotension and because I was it was not helping.  She has noted that the lightheadedness has improved on florinef.  She still gets lightheaded once per day.  She notices it most when she gets up after sitting for prolonged periods of time.  This happens each day when she is at choir practice.  Yesterday she had an episode of heart racing whle studying.  After that the room started spinning spinning and her hands were shaky.  The episode lasted for a few minutes.  There as associated shortness of breath but no chest pain or pressure.  She was not feeling anxious at the time.  She has not noted lower extremity edema or headache.   Interval History 07/11/15: At her last appointment Diane Hanson was started on propranolol 10 mg tid.  She was also instructed to increase her fluid and salt intake.  Thyroid function was normal.  She continued to have episodes of palpitations and was seen by Diane Skene, PA-C on 12/12.  She was not orthostatic at that appointment.  She was instructed to wear compression stockings and increase her sodium intake to at least 2500 mg.  There was also concern that anxiety and stress may be contributing.  Since then she has been feeling dizzy when she stands and has been feeling palpitaitions.   Twice she felt like she was going to pass out.  Her vision feels like it is going black. This sometimes happens when getting out of shower.  She denies syncope.  She has increased her fluid intake to roughly 72 oz of fluid daily.  She has also worn compression stockings without improvement.  She has some dizziness daily but had 2 episodes of near syncope.  She also feels lightheaded when working out at [employer]  She also notes some palpitations when working out at the gym.  Diane Hanson is planning to travel abroad to [place] for 1 month this summer.  History of Present Illness 02/23/15:  Diane Hanson was seen in clinic on 8/31 for an evaluation of syncope.  At that appointment she was noted to be on spironolactone for acne, which she was asked to stop due to concern for orthostasis.  She was referred for echo due to a heart murmur, which was unremarkable.  She also had a 14 day event monitor that showed sinus rhythm, sinus arrhythmia and sinus tachycardia.  She reported one event of heart fluttering and skipped beats, at which time her rhythm was sinus arrhythmia and sinus tachycardia.  She also reported chest pain and shortness of breath when her heart rhythm was 161 bpm.   Diane Hanson reports stopping spironolactone as instructed.  She continues to note episodes of dizziness especially with exertion.  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  She specializes in vocal performance and opera.   Past Medical History  Diagnosis Date  . Syncope   . Sinus arrhythmia 02/23/2015  . Orthostatic hypotension 07/11/2015    Past Surgical History  Procedure Laterality Date  . Tonsillectomy       Current Outpatient Prescriptions  Medication Sig Dispense Refill  . Doxycycline Hyclate (ACTICLATE) 150 MG TABS Take by mouth once.    . fludrocortisone (FLORINEF) 0.1 MG tablet Take 2 tablets (0.2 mg total) by mouth daily. 60 tablet 5  . ibuprofen  (ADVIL,MOTRIN) 200 MG tablet Take 400 mg by mouth every 6 (six) hours as needed for moderate pain.     No current facility-administered medications for this visit.    Allergies:   Review of patient's allergies indicates no known allergies.    Social History:  The patient  reports that she has never smoked. She does not have any smokeless tobacco history on file. She reports that she does not drink alcohol.   Family History:  The patient's family history is not on file.    ROS:  Please see the history of present illness.   Otherwise, review of systems are positive for none.   All other systems are reviewed and negative.    PHYSICAL EXAM: VS:  BP 96/68 mmHg  Pulse 57  Ht  (1.702 m)  Wt 64.501 kg (142 lb 3.2 oz)  BMI 22.27 kg/m2 , BMI Body mass index is 22.27 kg/(m^2).   Supine BP 110/62 GENERAL:  Well appearing HEENT:  Pupils equal round and reactive, fundi not visualized, oral mucosa unremarkable NECK:  No jugular venous distention, waveform within normal limits, carotid upstroke brisk and symmetric, no bruits LYMPHATICS:  No cervical adenopathy LUNGS:  Clear to auscultation bilaterally HEART:  RRR.  PMI not displaced or sustained,S1 and S2 within normal limits, no S3, no S4, no clicks, no rubs, no murmurs ABD:  Flat, positive bowel sounds normal in frequency in pitch, no bruits, no rebound, no guarding, no midline pulsatile mass, no hepatomegaly, no splenomegaly EXT:  2 plus pulses throughout, no edema, no cyanosis no clubbing SKIN:  No rashes no nodules NEURO:  Cranial nerves II through XII grossly intact, motor grossly intact throughout PSYCH:  Cognitively intact, oriented to person place and time   EKG:  EKG is ordered today.  Sinus arrhythmia rate 57 BPM.  Recent Labs: 01/01/2015: Hemoglobin 13.4; Platelets 235 02/28/2015: TSH 1.153 07/25/2015: BUN 13; Creat 0.72; Potassium 4.3; Sodium 139    Lipid Panel No results found for: CHOL, TRIG, HDL, CHOLHDL, VLDL,  LDLCALC, LDLDIRECT    Wt Readings from Last 3 Encounters:  08/17/15 64.501 kg (142 lb 3.2 oz)  07/11/15 64.411 kg (142 lb)  05/02/15 68.584 kg (151 lb 3.2 oz)    Echo 02/01/15: Study Conclusions  - Left ventricle: The cavity size was normal. Wall thickness was normal. Systolic function was normal. The estimated ejection fraction was in the range of 55% to 60%. Wall motion was normal; there were no regional wall motion abnormalities. - Pulmonary arteries: Systolic pressure was mildly increased. PA peak pressure: 31 mm Hg (S).  14 Day Event Monitor 02/01/15:  Quality: Fair. Baseline artifact. Rhythms Recorded: sinus rhythm, sinus arrhythmia and sinus tachycardia There were no PACs or PVCs There were no pauses >2.5s  Patient did submit a symptom diary. At the time when heart fluttering and  skipped beats were reported, the rhythm was sinus arrhythmia and sinus tachycardia, rate 123 bpm. She also reported chest pain and shortness of breath. At that time sinus tachycardia was noted at 161 bpm.  Other studies Reviewed: Additional studies/ records that were reviewed today include: . Review of the above records demonstrates:  Please see elsewhere in the note.     ASSESSMENT AND PLAN:  # Orthostatic hypotension: Henretter's blood pressure remains low today.  She still has symptoms of orthostasis, though it has improved.  Her supine BP remains within normal limits.  We will increase florinef to 0.2 mg daily.  We will check a BMP today.  # Palpitations: She had an episode of tachycardia and lightheadedness yesterday.  It is unclear what caused this.  Thus far it is an isolated episode.  We will check a BMP as above given that she is on florinef.  Differential includes a vagal reaction, exacerbation of her sinus arrhythmia, and anxiety, as she was studying at the time. We will continue to monitor for now given that it was an isolated episode.   Current medicines are reviewed at length  with the patient today.  The patient does not have concerns regarding medicines.  Labs/ tests ordered today include:   Orders Placed This Encounter  Procedures  . Basic metabolic panel  . EKG 12-Lead     Disposition:   FU with Dr. Elmarie Shileyiffany C. Duke SalviaRandolph in June.  She goes to [place] for study abroad in July.   Signed, Madilyn Hookandolph, Drusilla Wampole P, MD  08/17/2015 12:47 PM    [facility]

## 2015-08-17 ENCOUNTER — Encounter: Payer: Self-pay | Admitting: Cardiovascular Disease

## 2015-08-17 ENCOUNTER — Ambulatory Visit (INDEPENDENT_AMBULATORY_CARE_PROVIDER_SITE_OTHER): Payer: BLUE CROSS/BLUE SHIELD | Admitting: Cardiovascular Disease

## 2015-08-17 VITALS — BP 96/68 | HR 57 | Ht 67.0 in | Wt 142.2 lb

## 2015-08-17 DIAGNOSIS — I498 Other specified cardiac arrhythmias: Secondary | ICD-10-CM

## 2015-08-17 DIAGNOSIS — Z79899 Other long term (current) drug therapy: Secondary | ICD-10-CM | POA: Diagnosis not present

## 2015-08-17 DIAGNOSIS — R002 Palpitations: Secondary | ICD-10-CM | POA: Diagnosis not present

## 2015-08-17 DIAGNOSIS — I951 Orthostatic hypotension: Secondary | ICD-10-CM | POA: Diagnosis not present

## 2015-08-17 LAB — BASIC METABOLIC PANEL
BUN: 13 mg/dL (ref 7–25)
CALCIUM: 9.6 mg/dL (ref 8.6–10.2)
CHLORIDE: 102 mmol/L (ref 98–110)
CO2: 27 mmol/L (ref 20–31)
CREATININE: 0.64 mg/dL (ref 0.50–1.10)
Glucose, Bld: 92 mg/dL (ref 65–99)
Potassium: 4.3 mmol/L (ref 3.5–5.3)
SODIUM: 139 mmol/L (ref 135–146)

## 2015-08-17 MED ORDER — FLUDROCORTISONE ACETATE 0.1 MG PO TABS: 0.2000 mg | ORAL_TABLET | Freq: Every day | ORAL | Status: DC

## 2015-08-17 NOTE — Patient Instructions (Signed)
Medication Instructions:  INCREASE YOUR FLORINEF TO 0.2 MG   Labwork: BMET DOWNSTAIRS ON FIRST FLOOR AT [facility]  Testing/Procedures: NONE  Follow-Up: Your physician recommends that you schedule a follow-up appointment in: June   If you need a refill on your cardiac medications before your next appointment, please call your pharmacy.

## 2015-08-23 ENCOUNTER — Telehealth: Payer: Self-pay | Admitting: *Deleted

## 2015-08-23 NOTE — Telephone Encounter (Signed)
Left message to call back  

## 2015-08-23 NOTE — Telephone Encounter (Signed)
-----   Message from Chilton Siiffany [place], MD sent at 08/23/2015  4:58 PM EDT ----- Normal kidney function and electrolytes.  Okay to take Florinef as prescribed.

## 2015-09-09 NOTE — Telephone Encounter (Signed)
Left message on 4/4, never received call back Unable to reach or leave message, released in [facility] with [facility] Leonides Sakeandolph's note

## 2015-10-26 DIAGNOSIS — H6981 Other specified disorders of Eustachian tube, right ear: Secondary | ICD-10-CM | POA: Diagnosis not present

## 2015-11-01 ENCOUNTER — Encounter: Payer: Self-pay | Admitting: Cardiovascular Disease

## 2015-11-01 ENCOUNTER — Ambulatory Visit (INDEPENDENT_AMBULATORY_CARE_PROVIDER_SITE_OTHER): Payer: BLUE CROSS/BLUE SHIELD | Admitting: Cardiovascular Disease

## 2015-11-01 VITALS — BP 107/63 | HR 53 | Ht 67.0 in | Wt 147.2 lb

## 2015-11-01 DIAGNOSIS — I498 Other specified cardiac arrhythmias: Secondary | ICD-10-CM | POA: Diagnosis not present

## 2015-11-01 DIAGNOSIS — Z79899 Other long term (current) drug therapy: Secondary | ICD-10-CM | POA: Diagnosis not present

## 2015-11-01 DIAGNOSIS — I951 Orthostatic hypotension: Secondary | ICD-10-CM

## 2015-11-01 DIAGNOSIS — R002 Palpitations: Secondary | ICD-10-CM

## 2015-11-01 MED ORDER — FLUDROCORTISONE ACETATE 0.1 MG PO TABS: 0.2000 mg | ORAL_TABLET | Freq: Every day | ORAL | Status: DC

## 2015-11-01 NOTE — Progress Notes (Signed)
Cardiology Office Note   Date:  11/01/2015   ID:  Diane Hanson Hise, DOB 1995/05/22, MRN [id number]  PCP:  Diane HedgerEBBIE SCHOENHOFF, MD  Cardiologist:   Diane Siiffany [place], MD   Chief Complaint  Patient presents with  . Follow-up    pt denied chest pain and SOB    Patient ID: Diane Hanson Diane Hanson is Hanson 20 y.o. female with syncope who presents for follow up.   History of Present Illness:  Ms. Diane Hanson was seen in clinic in 12/2014 for an evaluation of syncope.  At that appointment she was noted to be on spironolactone for acne, which she was asked to stop due to concern for orthostasis.  She was referred for echo due to Hanson heart murmur, which was unremarkable.  She also had Hanson 14 day event monitor that showed sinus rhythm, sinus arrhythmia and sinus tachycardia.  She reported one event of heart fluttering and skipped beats, at which time her rhythm was sinus arrhythmia and sinus tachycardia.  She also reported chest pain and shortness of breath when her heart rhythm was 161 bpm. She continued to have dizziness after stopping spironolactone. She was started on Florinef 0.1 mg daily. Propranolol was stopped due to hypotension and because it was not helping. Her symptoms improved but were still present so Florinef was increased to 0.2 mg on 07/2015.  Since her last appointment Diane Hanson has been feeling well.  She notes improvement in the dizziness.  She now has mild dizziness once per week.  She denies syncope.  She also has not noted any palpitations, chest pain or shortness of breath. She continues to increase her fluid intake and thinks that this is helping as well. She is planning to travel abroad and nausea for one month this summer.   Past Medical History  Diagnosis Date  . Syncope   . Sinus arrhythmia 02/23/2015  . Orthostatic hypotension 07/11/2015    Past Surgical History  Procedure Laterality Date  . Tonsillectomy       Current Outpatient Prescriptions  Medication Sig Dispense Refill  .  fludrocortisone (FLORINEF) 0.1 MG tablet Take 2 tablets (0.2 mg total) by mouth daily. 180 tablet 3  . ibuprofen (ADVIL,MOTRIN) 200 MG tablet Take 400 mg by mouth every 6 (six) hours as needed for moderate pain.     No current facility-administered medications for this visit.    Allergies:   Review of patient's allergies indicates no known allergies.    Social History:  The patient  reports that she has never smoked. She does not have any smokeless tobacco history on file. She reports that she does not drink alcohol.   Family History:  The patient's Family history is unknown by patient.    ROS:  Please see the history of present illness.   Otherwise, review of systems are positive for none.   All other systems are reviewed and negative.    PHYSICAL EXAM: VS:  BP 107/63 mmHg  Pulse 53  Ht 5\' 7"  (1.702 m)  Wt 147 lb 3.2 oz (66.769 kg)  BMI 23.05 kg/m2 , BMI Body mass index is 23.05 kg/(m^2).   Supine BP 110/62 GENERAL:  Well appearing HEENT:  Pupils equal round and reactive, fundi not visualized, oral mucosa unremarkable NECK:  No jugular venous distention, waveform within normal limits, carotid upstroke brisk and symmetric, no bruits LYMPHATICS:  No cervical adenopathy LUNGS:  Clear to auscultation bilaterally HEART:  RRR.  PMI not displaced or sustained,S1 and S2 within normal limits,  no S3, no S4, no clicks, no rubs, no murmurs ABD:  Flat, positive bowel sounds normal in frequency in pitch, no bruits, no rebound, no guarding, no midline pulsatile mass, no hepatomegaly, no splenomegaly EXT:  2 plus pulses throughout, no edema, no cyanosis no clubbing SKIN:  No rashes no nodules NEURO:  Cranial nerves II through XII grossly intact, motor grossly intact throughout PSYCH:  Cognitively intact, oriented to person place and time   EKG:  EKG is ordered today.  Sinus arrhythmia rate 57 BPM.  Recent Labs: 01/01/2015: Hemoglobin 13.4; Platelets 235 02/28/2015: TSH 1.153 08/17/2015: BUN  13; Creat 0.64; Potassium 4.3; Sodium 139    Lipid Panel No results found for: CHOL, TRIG, HDL, CHOLHDL, VLDL, LDLCALC, LDLDIRECT    Wt Readings from Last 3 Encounters:  11/01/15 147 lb 3.2 oz (66.769 kg)  08/17/15 142 lb 3.2 oz (64.501 kg)  07/11/15 142 lb (64.411 kg)    Echo 02/01/15: Study Conclusions  - Left ventricle: The cavity size was normal. Wall thickness was normal. Systolic function was normal. The estimated ejection fraction was in the range of 55% to 60%. Wall motion was normal; there were no regional wall motion abnormalities. - Pulmonary arteries: Systolic pressure was mildly increased. PA peak pressure: 31 mm Hg (S).  14 Day Event Monitor 02/01/15:  Quality: Fair. Baseline artifact. Rhythms Recorded: sinus rhythm, sinus arrhythmia and sinus tachycardia There were no PACs or PVCs There were no pauses >2.5s  Patient did submit Hanson symptom diary. At the time when heart fluttering and skipped beats were reported, the rhythm was sinus arrhythmia and sinus tachycardia, rate 123 bpm. She also reported chest pain and shortness of breath. At that time sinus tachycardia was noted at 161 bpm.  Other studies Reviewed: Additional studies/ records that were reviewed today include: . Review of the above records demonstrates:  Please see elsewhere in the note.     ASSESSMENT AND PLAN:  # Orthostatic hypotension: Diane Hanson's blood pressure is better-controlled. She has very mild symptoms of dizziness with positional changes. She is doing well on her current dose of Florinef. We will not make any changes at this time. We will check Hanson basic metabolic panel to assess her electrolytes.  # Palpitations: She had an episode of tachycardia and lightheadedness yesterday.  It is unclear what caused this.  Thus far it is an isolated episode.  We will check Hanson BMP as above given that she is on florinef.  Differential includes Hanson vagal reaction, exacerbation of her sinus arrhythmia, and  anxiety, as she was studying at the time. We will continue to monitor for now given that it was an isolated episode.   Current medicines are reviewed at length with the patient today.  The patient does not have concerns regarding medicines.  Labs/ tests ordered today include:   Orders Placed This Encounter  Procedures  . Basic Metabolic Panel (BMET)   Time spent: 15 minutes-Greater than 50% of this time was spent in counseling, explanation of diagnosis, planning of further management, and coordination of care.  Disposition:   FU with Dr. Elmarie Shiley C. Duke Salvia in 1 year.   Signed, Diane Si, MD  11/01/2015 5:44 PM    [facility]

## 2015-11-01 NOTE — Patient Instructions (Signed)
Medication Instructions:   NO CHANGE  Labwork:  Your physician recommends that you HAVE LAB WORK TODAY  Follow-Up:  Your physician wants you to follow-up in: ONE YEAR WITH DR [place] You will receive a reminder letter in the mail two months in advance. If you don't receive a letter, please call our office to schedule the follow-up appointment.   If you need a refill on your cardiac medications before your next appointment, please call your pharmacy.  

## 2015-11-02 LAB — BASIC METABOLIC PANEL
BUN: 14 mg/dL (ref 7–25)
CHLORIDE: 103 mmol/L (ref 98–110)
CO2: 25 mmol/L (ref 20–31)
Calcium: 8.8 mg/dL (ref 8.6–10.2)
Creat: 0.74 mg/dL (ref 0.50–1.10)
GLUCOSE: 90 mg/dL (ref 65–99)
POTASSIUM: 4.3 mmol/L (ref 3.5–5.3)
SODIUM: 138 mmol/L (ref 135–146)

## 2016-01-11 ENCOUNTER — Telehealth: Payer: Self-pay | Admitting: Cardiovascular Disease

## 2016-01-11 NOTE — Telephone Encounter (Signed)
Spoke with pt mother, for the last month the pt has reported an increase in her tachycardia. She has been out of the country and started back to school Monday and has been quite anxious. She has had some dizziness with standing but has increased her fluid intake and salt. Her mother felt the anxiety is probably the biggest contributor. The pt has never taken anything for anxiety and referred them back to the primary care to discuss. theywill give us a call back if symptoms do not improve or change.

## 2016-01-11 NOTE — Telephone Encounter (Signed)
New message    Pt mother is calling  Patient c/o Palpitations:  High priority if patient c/o lightheadedness and shortness of breath.  1. How long have you been having palpitations? month  2. Are you currently experiencing lightheadedness and shortness of breath? yes  3. Have you checked your BP and heart rate? (document readings) no 4. Are you experiencing any other symptoms? Anxious

## 2016-02-08 DIAGNOSIS — J069 Acute upper respiratory infection, unspecified: Secondary | ICD-10-CM | POA: Diagnosis not present

## 2016-02-08 DIAGNOSIS — J3089 Other allergic rhinitis: Secondary | ICD-10-CM | POA: Diagnosis not present

## 2016-03-06 ENCOUNTER — Telehealth: Payer: Self-pay | Admitting: Cardiovascular Disease

## 2016-03-06 NOTE — Telephone Encounter (Signed)
New message       Pt is having dizziness and lightheadedness.  Please advise

## 2016-03-06 NOTE — Telephone Encounter (Signed)
Spoke with patient and she stated that every time she stood up she was feeling dizzy/lightheaded like she was going to pass out.  She did increase her salt and fluid intake She does not check her blood pressure at home Confirmed patient is taking Florinef 0.1 mg 2 tablets daily  Will forward Dr Duke Salviaandolph for review

## 2016-03-07 NOTE — Telephone Encounter (Signed)
Lets add propranolol 10 mg bid.  Follow up with me or APP in 2 weeks.  Try adding compression stockings.

## 2016-03-08 ENCOUNTER — Encounter: Payer: Self-pay | Admitting: Physician Assistant

## 2016-03-08 ENCOUNTER — Ambulatory Visit: Payer: BLUE CROSS/BLUE SHIELD | Admitting: Physician Assistant

## 2016-03-08 ENCOUNTER — Ambulatory Visit (INDEPENDENT_AMBULATORY_CARE_PROVIDER_SITE_OTHER): Payer: BLUE CROSS/BLUE SHIELD | Admitting: Physician Assistant

## 2016-03-08 VITALS — BP 119/74 | HR 66 | Ht 67.0 in | Wt 141.4 lb

## 2016-03-08 DIAGNOSIS — R55 Syncope and collapse: Secondary | ICD-10-CM | POA: Diagnosis not present

## 2016-03-08 DIAGNOSIS — I951 Orthostatic hypotension: Secondary | ICD-10-CM

## 2016-03-08 DIAGNOSIS — R000 Tachycardia, unspecified: Secondary | ICD-10-CM | POA: Diagnosis not present

## 2016-03-08 NOTE — Patient Instructions (Signed)
Gradually increase size of breakfast and lunch. Increase activity, but try to get plenty of rest.  Increase water intake to 1/2 gallon daily--gatoraide is ok.  Try compression stockings as well.  If dietary changes do not help within 2-3 weeks, increase Florinef to 0.2 mg twice daily.  Labwork:  Your physician recommends that you return for lab work today: BMET, Magnesium  Follow-Up:  Your physician recommends that you schedule a follow-up appointment in: 1st available with Dr. Duke Salviaandolph. You may cancel this appointment if you are doing well.

## 2016-03-08 NOTE — Progress Notes (Signed)
Cardiology Office Note   Date:  03/08/2016   ID:  Diane BrunetteLogan A Howson, DOB 1996/03/04, MRN [id number]  PCP:  Levon HedgerEBBIE SCHOENHOFF, MD  Cardiologist:  Dr Duke Salviaandolph 11/01/2015 Theodore DemarkBarrett, Rhonda, PA-C   Chief Complaint  Patient presents with  . Dizziness    daily for the past week    History of Present Illness: Diane Hanson is a 20 y.o. female with a history of syncope, Sinus tachycardia and sinus arrhythmia on monitor, orthostatic dizziness on Florinef.   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 While she was in [place], she remembers that the food was heavier than it is here, and she was eating larger meals at breakfast and lunch. She was also walking a great deal, more than she does now.  Since getting back, she has resumed her usual schedule eating yogurt and granola at breakfast and a sandwich with may be an apple or chips for lunch. The symptoms of orthostatic hypotension and tachycardia have gradually returned. They have continued to get worse to the extent that she has had 2 episodes of near-syncope. These both were orthostatic in nature.  When she gets the orthostatic dizziness/presyncope, she will feel her heart rate increase. She has a fit bit, but it does not check her heart rate so she doesn't know exactly how fast her heart is beating. She is just aware that it is beating faster than normal.  She feels that she does well at hydration and drinks plenty of water. She also drinks Gatorade at times. She has not had palpitations except in the setting of orthostatic symptoms.   Past Medical History:  Diagnosis Date  . Orthostatic hypotension 07/11/2015  . Sinus arrhythmia 02/23/2015  . Syncope     Past Surgical History:  Procedure Laterality Date  . TONSILLECTOMY      Current Outpatient Prescriptions  Medication Sig  Dispense Refill  . fludrocortisone (FLORINEF) 0.1 MG tablet Take 1 tablet (0.1 mg total) by mouth twice a day . 180 tablet 3   No current facility-administered medications for this visit.     Allergies:   Review of patient's allergies indicates no known allergies.    Social History:  The patient  reports that she has never smoked. She has never used smokeless tobacco. She reports that she does not drink alcohol.   Family History:  The patient's Family history is unknown by patient.    ROS:  Please see the history of present illness. All other systems are reviewed and negative.    PHYSICAL EXAM: VS:  BP 119/74   Pulse 66   Ht 5\' 7"  (1.702 m)   Wt 141 lb 6.4 oz (64.1 kg)   BMI 22.15 kg/m  , BMI Body mass index is 22.15 kg/m. GEN: Well nourished, well developed, female in no acute distress  HEENT: normal for age  Neck: no JVD, no carotid bruit, no masses Cardiac: RRR; no murmur, no rubs, or gallops Respiratory:  clear to auscultation bilaterally, normal work of breathing GI: soft, nontender, nondistended, + BS MS: no deformity or atrophy; no edema; distal pulses are 2+ in all 4 extremities   Skin: warm and dry, no rash Neuro:  Strength and sensation are intact Psych: euthymic mood, full affect   EKG:  EKG is not ordered today.  Recent Labs: 11/01/2015: BUN 14; Creat 0.74; Potassium 4.3; Sodium  138    Lipid Panel No results found for: CHOL, TRIG, HDL, CHOLHDL, VLDL, LDLCALC, LDLDIRECT   Wt Readings from Last 3 Encounters:  03/08/16 141 lb 6.4 oz (64.1 kg)  11/01/15 147 lb 3.2 oz (66.8 kg)  08/17/15 142 lb 3.2 oz (64.5 kg)     Other studies Reviewed: Additional studies/ records that were reviewed today include: Office notes and previous testing.  ASSESSMENT AND PLAN:  1.  Orthostatic hypotension: It is very interesting that while she was in [place], she was walking much more than she does hear, she was eating larger meals for breakfast and lunch than she does hear,  and her symptoms almost completely resolved. She is encouraged to mimic that lifestyle here with making sure she gets plenty of ambulation, increases the volume of what she eats at breakfast and lunch, and continues to be aggressive about pursuing hydration. If this does not help her symptoms, we can increase her Florinef up to 0.2 mg, a.m. and 0.1 mg p.m. She states the symptoms are much worse after she has been sitting for a long period of time.  We will check a BMET and magnesium level today as she has had some leg cramping. She does not have a history of hyper-kalemia or hyponatremia.  2. Near syncope: This is when she first gets out of the chair after she has been sitting a long time. She understands the need to be careful and it is hoped she will be able to manipulate her environment in her activities to the point that she is controlling this.  3. Tachycardia: This is most likely sinus tachycardia in the setting of her orthostatic symptoms. She is does not seem to have POTS. I advised her that it would be helpful if she could wear a watch of the kind that will record her blood pressure so we can track it and no exactly how fast her heart is beating. She is agreeable to this.   Current medicines are reviewed at length with the patient today.  The patient does not have concerns regarding medicines.  The following changes have been made:  Increase Florinef in 2 weeks if behavioral and environmental modifications are not successful.  Labs/ tests ordered today include:   Orders Placed This Encounter  Procedures  . BASIC METABOLIC PANEL WITH GFR  . Magnesium     Disposition:   FU with Dr. Duke Salvia  Signed, Barrett, Bjorn Loser, PA-C  03/08/2016 5:53 PM    [facility] Phone: [phone]; Fax: [phone]  This note was written with the assistance of speech recognition software. Please excuse any transcriptional errors.

## 2016-03-09 LAB — BASIC METABOLIC PANEL WITH GFR
BUN: 16 mg/dL (ref 7–25)
CALCIUM: 9.1 mg/dL (ref 8.6–10.2)
CO2: 27 mmol/L (ref 20–31)
CREATININE: 0.76 mg/dL (ref 0.50–1.10)
Chloride: 106 mmol/L (ref 98–110)
GFR, Est African American: >89 mL/min (ref >=60)
GFR, Est Non African American: >89 mL/min (ref >=60)
GLUCOSE: 88 mg/dL (ref 65–99)
Potassium: 4.4 mmol/L (ref 3.5–5.3)
SODIUM: 140 mmol/L (ref 135–146)

## 2016-03-09 LAB — MAGNESIUM: Magnesium: 1.8 mg/dL (ref 1.5–2.5)

## 2016-03-09 NOTE — Telephone Encounter (Signed)
Patient seen by Hermenia Bershonda B PA yesterday and has follow up with Dr Duke Salviaandolph 03/27/16

## 2016-03-13 ENCOUNTER — Telehealth: Payer: Self-pay | Admitting: Cardiovascular Disease

## 2016-03-13 NOTE — Telephone Encounter (Signed)
Pt would like her lab results from last week pleaase.

## 2016-03-13 NOTE — Telephone Encounter (Signed)
Left message on secured voice mail  Labs have not been reviewed , preliminary Labs re in normal limits   can be reviewed on mychart.  any question may call back.

## 2016-03-15 ENCOUNTER — Telehealth: Payer: Self-pay | Admitting: Cardiovascular Disease

## 2016-03-15 NOTE — Telephone Encounter (Signed)
Returned call to patient no answer.LMTC. 

## 2016-03-15 NOTE — Telephone Encounter (Signed)
Returned call to patient lab results given.Stated she still gets dizzy and light headed when she first stands up.Advised I will send message to Theodore Demarkhonda Barrett PA.

## 2016-03-15 NOTE — Telephone Encounter (Signed)
F/u Message ° °Pt returning RN call about results. Please call back to discuss  °

## 2016-03-15 NOTE — Telephone Encounter (Signed)
F/u Message ° °Pt returning Rn call. Please call back to discuss  °

## 2016-03-19 NOTE — Telephone Encounter (Signed)
Please let her know I spoke w/ Dr Duke Salviaandolph. Continue to emphasize increased PO intake, salt use and plenty of water. Keep appt w/ Dr Duke Salviaandolph coming up. Let us know if sx worsen.

## 2016-03-19 NOTE — Telephone Encounter (Signed)
Spoke to patient Diane SchultzRhonda Barrett's recommendations given.Advised to keep appointment with Dr.Dennis Acres 03/27/16 at 8:45 am.Advised to call sooner if needed.

## 2016-03-27 ENCOUNTER — Encounter: Payer: Self-pay | Admitting: *Deleted

## 2016-03-27 ENCOUNTER — Encounter: Payer: Self-pay | Admitting: Cardiovascular Disease

## 2016-03-27 ENCOUNTER — Ambulatory Visit (INDEPENDENT_AMBULATORY_CARE_PROVIDER_SITE_OTHER): Payer: BLUE CROSS/BLUE SHIELD | Admitting: Cardiovascular Disease

## 2016-03-27 VITALS — BP 100/62 | HR 91 | Ht 67.0 in | Wt 145.0 lb

## 2016-03-27 DIAGNOSIS — I498 Other specified cardiac arrhythmias: Secondary | ICD-10-CM

## 2016-03-27 DIAGNOSIS — I499 Cardiac arrhythmia, unspecified: Secondary | ICD-10-CM | POA: Diagnosis not present

## 2016-03-27 DIAGNOSIS — R55 Syncope and collapse: Secondary | ICD-10-CM | POA: Diagnosis not present

## 2016-03-27 DIAGNOSIS — I951 Orthostatic hypotension: Secondary | ICD-10-CM

## 2016-03-27 DIAGNOSIS — R002 Palpitations: Secondary | ICD-10-CM

## 2016-03-27 MED ORDER — PROPRANOLOL HCL 10 MG PO TABS: 10.0000 mg | ORAL_TABLET | Freq: Two times a day (BID) | ORAL | 1 refills | Status: DC

## 2016-03-27 NOTE — Progress Notes (Signed)
Cardiology Office Note   Date:  03/27/2016   ID:  [name], DOB [birth year], MRN [id number]  PCP:  Levon Hedger, MD  Cardiologist:   Chilton Si, MD   Chief Complaint  Patient presents with  . Follow-up    pt c/o dizziness and palpitations    Patient ID: Diane Hanson is a 20 y.o. female with syncope who presents for follow up.   History of Present Illness:  Diane Hanson was seen in clinic in 12/2014 for an evaluation of syncope.  At that appointment she was noted to be on spironolactone for acne, which she was asked to stop due to concern for orthostasis.  She was referred for echo 01/2015 due to a heart murmur, which was unremarkable.  She also had a 14 day event monitor that showed sinus rhythm, sinus arrhythmia and sinus tachycardia.  She reported one event of heart fluttering and skipped beats, at which time her rhythm was sinus arrhythmia and sinus tachycardia.  She also reported chest pain and shortness of breath when her heart rhythm was 161 bpm. She continued to have dizziness after stopping spironolactone. She was started on Florinef 0.1 mg daily. Propranolol was stopped due to hypotension and because it was not helping. Her symptoms improved but were still present so Florinef was increased to 0.2 mg on 07/2015.  Diane Hanson was doing well until she returned from her trip abroad.  She followed up with Theodore Demark on 10/19 and reported increased dizziness.  It was Felt that her symptoms were due to changes in her diet since returning back to the [place]. While abroad she was eating much heavier meals and a higher salt content. Sensing Bjorn Loser she has tried to increase her oral intake but her symptoms persist. She notes episodes of profound dizziness that are worse when sitting in the choir. She notes repeated sitting and standing as well as prolonged standing both make her presyncopal. When singing in the choir her vision sometimes becomes black and she feels as though  she is going to pass out. She has been wearing compression stockings and continues to increase her intake. She also sometimes notes that her heart races to the 160 even at rest.  It is worse when she goes from a seated to standing position.    Past Medical History:  Diagnosis Date  . Orthostatic hypotension 07/11/2015  . Sinus arrhythmia 02/23/2015  . Syncope     Past Surgical History:  Procedure Laterality Date  . TONSILLECTOMY       Current Outpatient Prescriptions  Medication Sig Dispense Refill  . fludrocortisone (FLORINEF) 0.1 MG tablet Take 2 tablets (0.2 mg total) by mouth daily. 180 tablet 3  . propranolol (INDERAL) 10 MG tablet Take 1 tablet (10 mg total) by mouth 2 (two) times daily. 60 tablet 1   No current facility-administered medications for this visit.     Allergies:   Patient has no known allergies.    Social History:  The patient  reports that she has never smoked. She has never used smokeless tobacco. She reports that she does not drink alcohol.   Family History:  The patient's Family history is unknown by patient.    ROS:  Please see the history of present illness.   Otherwise, review of systems are positive for none.   All other systems are reviewed and negative.    PHYSICAL EXAM: VS:  BP 100/62 (BP Location: Right Arm, Patient Position: Sitting, Cuff Size: Normal)  Pulse 91   Ht 5\' 7"  (1.702 m)   Wt 65.8 kg (145 lb)   SpO2 99%   BMI 22.71 kg/m  , BMI Body mass index is 22.71 kg/m.   Supine BP 110/62 GENERAL:  Well appearing HEENT:  Pupils equal round and reactive, fundi not visualized, oral mucosa unremarkable NECK:  No jugular venous distention, waveform within normal limits, carotid upstroke brisk and symmetric, no bruits LYMPHATICS:  No cervical adenopathy LUNGS:  Clear to auscultation bilaterally HEART:  RRR.  PMI not displaced or sustained,S1 and S2 within normal limits, no S3, no S4, no clicks, no rubs, no murmurs ABD:  Flat, positive  bowel sounds normal in frequency in pitch, no bruits, no rebound, no guarding, no midline pulsatile mass, no hepatomegaly, no splenomegaly EXT:  2 plus pulses throughout, no edema, no cyanosis no clubbing SKIN:  No rashes no nodules NEURO:  Cranial nerves II through XII grossly intact, motor grossly intact throughout PSYCH:  Cognitively intact, oriented to person place and time   EKG:  EKG is ordered today.   03/27/16: Sinus arrhythmia rate 91 bpm.  Recent Labs: 03/08/2016: BUN 16; Creat 0.76; Magnesium 1.8; Potassium 4.4; Sodium 140    Lipid Panel No results found for: CHOL, TRIG, HDL, CHOLHDL, VLDL, LDLCALC, LDLDIRECT    Wt Readings from Last 3 Encounters:  03/27/16 65.8 kg (145 lb)  03/08/16 64.1 kg (141 lb 6.4 oz)  11/01/15 66.8 kg (147 lb 3.2 oz)    Echo 02/01/15: Study Conclusions  - Left ventricle: The cavity size was normal. Wall thickness was normal. Systolic function was normal. The estimated ejection fraction was in the range of 55% to 60%. Wall motion was normal; there were no regional wall motion abnormalities. - Pulmonary arteries: Systolic pressure was mildly increased. PA peak pressure: 31 mm Hg (S).  14 Day Event Monitor 02/01/15:  Quality: Fair. Baseline artifact. Rhythms Recorded: sinus rhythm, sinus arrhythmia and sinus tachycardia There were no PACs or PVCs There were no pauses >2.5s  Patient did submit a symptom diary. At the time when heart fluttering and skipped beats were reported, the rhythm was sinus arrhythmia and sinus tachycardia, rate 123 bpm. She also reported chest pain and shortness of breath. At that time sinus tachycardia was noted at 161 bpm.  Other studies Reviewed: Additional studies/ records that were reviewed today include: . Review of the above records demonstrates:  Please see elsewhere in the note.     ASSESSMENT AND PLAN:  # Orthostatic hypotension:  ?POTS: [name] continues to have symptoms of orthostasis.  She  has been wearing compression stockings and increasing her oral intake.  She has been taking her florinef in the morning and at dinner.  I've asked her to start taking 0.2 mg in the morning to see if this will help increase her blood pressure more throughout the day. If this does not work she will take 1 in the morning and 0.1 at lunch time. If this does not work she will add propranolol 10 mg twice daily to her regimen, as it seems that there is a significant tachycardia component to her symptoms. Electrolytes were normal when checked last month on the same dose of Florinef.   Current medicines are reviewed at length with the patient today.  The patient does not have concerns regarding medicines.  Labs/ tests ordered today include:   No orders of the defined types were placed in this encounter.   Disposition:   FU with Dr. Elmarie Shileyiffany C.  [place] in 1 month.   Signed, Chilton Siiffany [place], MD  03/27/2016 9:42 AM    [facility]

## 2016-03-27 NOTE — Patient Instructions (Addendum)
Medication Instructions:  CHANGE YOUR FLORINEF TO 0.2 MG IN THE MORNING ONLY  IF THE ABOVE CHANGE DOES NOT WORK ADD PROPRANOLOL 10 MG TWICE A DAY. RX HAS BEEN SENT TO THE PHARMACY BUT IF YOU NEED IT FILLED CALL AND ASK TO FILL   Labwork: NONE  Testing/Procedures: NONE  Follow-Up: Your physician recommends that you schedule a follow-up appointment in: 1 MONTH OV  If you need a refill on your cardiac medications before your next appointment, please call your pharmacy.

## 2016-04-02 ENCOUNTER — Ambulatory Visit: Payer: BLUE CROSS/BLUE SHIELD | Admitting: Cardiovascular Disease

## 2016-04-02 ENCOUNTER — Encounter: Payer: Self-pay | Admitting: Cardiovascular Disease

## 2016-05-01 DIAGNOSIS — J209 Acute bronchitis, unspecified: Secondary | ICD-10-CM | POA: Diagnosis not present

## 2016-05-01 DIAGNOSIS — R05 Cough: Secondary | ICD-10-CM | POA: Diagnosis not present

## 2016-05-16 ENCOUNTER — Encounter: Payer: Self-pay | Admitting: Cardiovascular Disease

## 2016-05-16 ENCOUNTER — Ambulatory Visit (INDEPENDENT_AMBULATORY_CARE_PROVIDER_SITE_OTHER): Payer: BLUE CROSS/BLUE SHIELD | Admitting: Cardiovascular Disease

## 2016-05-16 VITALS — BP 100/58 | HR 49 | Ht 67.0 in | Wt 147.4 lb

## 2016-05-16 DIAGNOSIS — R638 Other symptoms and signs concerning food and fluid intake: Secondary | ICD-10-CM

## 2016-05-16 DIAGNOSIS — I951 Orthostatic hypotension: Secondary | ICD-10-CM

## 2016-05-16 DIAGNOSIS — IMO0001 Reserved for inherently not codable concepts without codable children: Secondary | ICD-10-CM

## 2016-05-16 MED ORDER — MIDODRINE HCL 5 MG PO TABS: 5.0000 mg | ORAL_TABLET | Freq: Two times a day (BID) | ORAL | 6 refills | Status: DC

## 2016-05-16 NOTE — Patient Instructions (Signed)
Stop florinef  Start midodrine 5 mg one tablet twice aday   Your physician recommends that you schedule a [facility]     Your physician recommends that you schedule a follow-up appointment in: 2 MONTHS WITH DR Whiting.   If you need a refill on your cardiac medications before your next appointment, please call your pharmacy.

## 2016-05-16 NOTE — Progress Notes (Signed)
Cardiology Office Note   Date:  05/16/2016   ID:  Diane Hanson, DOB Nov 17, 1995, MRN [id number]  PCP:  Levon HedgerEBBIE SCHOENHOFF, MD  Cardiologist:   Chilton Siiffany [place], MD   Chief Complaint  Patient presents with  . Follow-up    1 month; Pt states no Sx.     Patient ID: Diane Hanson is a 20 y.o. female with syncope who presents for follow up.   History of Present Illness:  Diane Hanson was seen in clinic in 12/2014 for an evaluation of syncope.  At that appointment she was noted to be on spironolactone for acne, which she was asked to stop due to concern for orthostasis.  She was referred for echo 01/2015 due to a heart murmur, which was unremarkable.  She also had a 14 day event monitor that showed sinus rhythm, sinus arrhythmia and sinus tachycardia.  She reported one event of heart fluttering and skipped beats, at which time her rhythm was sinus arrhythmia and sinus tachycardia.  She also reported chest pain and shortness of breath when her heart rhythm was 161 bpm. She continued to have dizziness after stopping spironolactone. She was started on Florinef 0.1 mg daily. Propranolol was stopped due to hypotension and because it was not helping. Her symptoms improved but were still present so Florinef was increased to 0.2 mg on 07/2015.  Diane Hanson was doing well until she returned from her trip abroad. She has been wearing compression stockings and continues to increase her intake.  She also noted palpitations with her heart racing to the 160s.  She was started on propranolol.  She notes that her symptoms have been better controlled.  However, she is now awakening frequently to drink.  She notes excessive thirst that occurs mostly at night.  She denies lower extremity edema or increased urination.     Past Medical History:  Diagnosis Date  . Orthostatic hypotension 07/11/2015  . Sinus arrhythmia 02/23/2015  . Syncope     Past Surgical History:  Procedure Laterality Date  .  TONSILLECTOMY       Current Outpatient Prescriptions  Medication Sig Dispense Refill  . propranolol (INDERAL) 10 MG tablet Take 1 tablet (10 mg total) by mouth 2 (two) times daily. 60 tablet 1  . midodrine (PROAMATINE) 5 MG tablet Take 1 tablet (5 mg total) by mouth 2 (two) times daily with a meal. 60 tablet 6   No current facility-administered medications for this visit.     Allergies:   Patient has no known allergies.    Social History:  The patient  reports that she has never smoked. She has never used smokeless tobacco. She reports that she does not drink alcohol.   Family History:  The patient's Family history is unknown by patient.    ROS:  Please see the history of present illness.   Otherwise, review of systems are positive for none.   All other systems are reviewed and negative.    PHYSICAL EXAM: VS:  BP (!) 100/58   Pulse (!) 49   Ht 5\' 7"  (1.702 m)   Wt 66.9 kg (147 lb 6.4 oz)   BMI 23.09 kg/m  , BMI Body mass index is 23.09 kg/m.   Supine BP 110/62 GENERAL:  Well appearing HEENT:  Pupils equal round and reactive, fundi not visualized, oral mucosa unremarkable NECK:  No jugular venous distention, waveform within normal limits, carotid upstroke brisk and symmetric, no bruits LYMPHATICS:  No cervical adenopathy LUNGS:  Clear to auscultation bilaterally HEART:  RRR.  PMI not displaced or sustained,S1 and S2 within normal limits, no S3, no S4, no clicks, no rubs, no murmurs ABD:  Flat, positive bowel sounds normal in frequency in pitch, no bruits, no rebound, no guarding, no midline pulsatile mass, no hepatomegaly, no splenomegaly EXT:  2 plus pulses throughout, no edema, no cyanosis no clubbing SKIN:  No rashes no nodules NEURO:  Cranial nerves II through XII grossly intact, motor grossly intact throughout PSYCH:  Cognitively intact, oriented to person place and time   EKG:  EKG is ordered today.   03/27/16: Sinus arrhythmia rate 91 bpm.  Recent  Labs: 03/08/2016: BUN 16; Creat 0.76; Magnesium 1.8; Potassium 4.4; Sodium 140    Lipid Panel No results found for: CHOL, TRIG, HDL, CHOLHDL, VLDL, LDLCALC, LDLDIRECT    Wt Readings from Last 3 Encounters:  05/16/16 66.9 kg (147 lb 6.4 oz)  03/27/16 65.8 kg (145 lb)  03/08/16 64.1 kg (141 lb 6.4 oz)    Echo 02/01/15: Study Conclusions  - Left ventricle: The cavity size was normal. Wall thickness was normal. Systolic function was normal. The estimated ejection fraction was in the range of 55% to 60%. Wall motion was normal; there were no regional wall motion abnormalities. - Pulmonary arteries: Systolic pressure was mildly increased. PA peak pressure: 31 mm Hg (S).  14 Day Event Monitor 02/01/15:  Quality: Fair. Baseline artifact. Rhythms Recorded: sinus rhythm, sinus arrhythmia and sinus tachycardia There were no PACs or PVCs There were no pauses >2.5s  Patient did submit a symptom diary. At the time when heart fluttering and skipped beats were reported, the rhythm was sinus arrhythmia and sinus tachycardia, rate 123 bpm. She also reported chest pain and shortness of breath. At that time sinus tachycardia was noted at 161 bpm.  Other studies Reviewed: Additional studies/ records that were reviewed today include: . Review of the above records demonstrates:  Please see elsewhere in the note.     ASSESSMENT AND PLAN:  # Orthostatic hypotension:  ?POTS: Symptoms are better controlled on florinef and propranolol.  However she now was excessive thirst.  I suspect that this may be due to the florinef.  We will switch from florinef to midodrine 5mg  bid.    Current medicines are reviewed at length with the patient today.  The patient does not have concerns regarding medicines.  Labs/ tests ordered today include:   No orders of the defined types were placed in this encounter.   Disposition:   FU with Dr. Elmarie Shileyiffany C. [place] in 2 months.  Rhonda Barrett in 2 weeks.     Signed, Chilton Siiffany [place], MD  05/16/2016 2:40 PM    [facility]

## 2016-05-25 ENCOUNTER — Ambulatory Visit (INDEPENDENT_AMBULATORY_CARE_PROVIDER_SITE_OTHER): Payer: BLUE CROSS/BLUE SHIELD | Admitting: Physician Assistant

## 2016-05-25 ENCOUNTER — Encounter: Payer: Self-pay | Admitting: Physician Assistant

## 2016-05-25 VITALS — BP 110/70 | HR 62 | Ht 67.0 in | Wt 148.0 lb

## 2016-05-25 DIAGNOSIS — R000 Tachycardia, unspecified: Secondary | ICD-10-CM

## 2016-05-25 DIAGNOSIS — I951 Orthostatic hypotension: Secondary | ICD-10-CM | POA: Diagnosis not present

## 2016-05-25 MED ORDER — MIDODRINE HCL 5 MG PO TABS: 5.0000 mg | ORAL_TABLET | Freq: Three times a day (TID) | ORAL | 6 refills | Status: DC

## 2016-05-25 NOTE — Patient Instructions (Signed)
Medication Instructions:  INCREASE MIDODRINE 5MG  TO THREE TIMES DAILY  If you need a refill on your cardiac medications before your next appointment, please call your pharmacy.  Follow-Up: AS SCHEDULED [facility]  HAPPY NEW YEAR!!  Thank you for choosing [facility]!!    Jenny Omdahl, LPN RHONDA BARRETT, PA-C

## 2016-05-25 NOTE — Progress Notes (Signed)
Cardiology Office Note   Date:  05/25/2016   ID:  Diane Hanson, DOB [birth year], MRN [id number]  PCP:  Levon HedgerEBBIE SCHOENHOFF, MD  Cardiologist:  Dr Verne Carrowandolph  Mauriah Mcmillen, PA-C   Chief Complaint  Patient presents with  . Follow-up    2 weeks from [place] medication change    History of Present Illness: Diane BrunetteLogan A Hanson is a 21 y.o. female with a history of syncope, nl EF by echo 01/2015, orthostatic sx w/ spironolactone d/c'd, event monitor w/ sinus arrhythmia, ST, SR. Prpranolol no help, Florinef started and uptitrated.  12/27 office visit and pt had restarted propranolol with the Florinef, doing ok but with thirst>>change Florinef to midodrine 5 mg  Diane Hanson presents for cardiology follow up.  The thirst has resolved. However, her symptoms have returned. They seem to be happening more frequently in the afternoon. She is only taking the midodrine twice a day. She gets dizzy and light-headed multiple times per day. Her tachycardia bothers her, but not as much. The propranolol is helping.  She is eating and drinking well. No LE edema. She is doing well in school, will be participating the operas put on by the school.   Past Medical History:  Diagnosis Date  . Orthostatic hypotension 07/11/2015  . Sinus arrhythmia 02/23/2015  . Syncope     Past Surgical History:  Procedure Laterality Date  . TONSILLECTOMY      Current Outpatient Prescriptions  Medication Sig Dispense Refill  . midodrine (PROAMATINE) 5 MG tablet Take 1 tablet (5 mg total) by mouth 2 (two) times daily with a meal. 60 tablet 6  . propranolol (INDERAL) 10 MG tablet Take 1 tablet (10 mg total) by mouth 2 (two) times daily. 60 tablet 1   No current facility-administered medications for this visit.     Allergies:   Patient has no known allergies.    Social History:  The patient  reports that she has never smoked. She has never used smokeless tobacco. She reports that she does not drink alcohol.    Family History:  The patient's Family history is unknown by patient.    ROS:  Please see the history of present illness. All other systems are reviewed and negative.    PHYSICAL EXAM: VS:  BP 110/70   Pulse 62   Ht 5\' 7"  (1.702 m)   Wt 148 lb (67.1 kg)   LMP 05/04/2016   BMI 23.18 kg/m  , BMI Body mass index is 23.18 kg/m. GEN: Well nourished, well developed, female in no acute distress  HEENT: normal for age  Neck: no JVD, no carotid bruit, no masses Cardiac: RRR; no murmur, no rubs, or gallops Respiratory:  clear to auscultation bilaterally, normal work of breathing GI: soft, nontender, nondistended, + BS MS: no deformity or atrophy; no edema; distal pulses are 2+ in all 4 extremities   Skin: warm and dry, no rash Neuro:  Strength and sensation are intact Psych: euthymic mood, full affect   EKG:  EKG is not ordered today.  Recent Labs: 03/08/2016: BUN 16; Creat 0.76; Magnesium 1.8; Potassium 4.4; Sodium 140    Lipid Panel No results found for: CHOL, TRIG, HDL, CHOLHDL, VLDL, LDLCALC, LDLDIRECT   Wt Readings from Last 3 Encounters:  05/25/16 148 lb (67.1 kg)  05/16/16 147 lb 6.4 oz (66.9 kg)  03/27/16 145 lb (65.8 kg)     Other studies Reviewed: Additional studies/ records that were reviewed today include: office notes and testing.  ASSESSMENT AND PLAN:  1.  Orthostatic hypotension: pt tolerating midodrine well, but it is not effective enough. However, she is only taking it bid. She is willing to go back on the Florinef and deal with the thirst, but her midodrine dose can be uptitrated. Will increase it to 5 mg tid, then try 10 mg tid if sx improved but not controlled. Can always try adding a low dose of Florinef to the midodrine if needed.  2. Tachycardia, ?POTS: Continue propranolol   Current medicines are reviewed at length with the patient today.  The patient has concerns regarding medicines. Concerns were addressed.  The following changes have been  made:  Increase midodrine  Labs/ tests ordered today include:  No orders of the defined types were placed in this encounter.    Disposition:   FU with Dr Duke Salvia  Signed, Theodore Demark, PA-C  05/25/2016 9:55 AM    [facility] Phone: [phone]; Fax: [phone]  This note was written with the assistance of speech recognition software. Please excuse any transcriptional errors.

## 2016-06-25 DIAGNOSIS — J069 Acute upper respiratory infection, unspecified: Secondary | ICD-10-CM | POA: Diagnosis not present

## 2016-07-19 ENCOUNTER — Ambulatory Visit (INDEPENDENT_AMBULATORY_CARE_PROVIDER_SITE_OTHER): Payer: BLUE CROSS/BLUE SHIELD | Admitting: Cardiovascular Disease

## 2016-07-19 ENCOUNTER — Encounter: Payer: Self-pay | Admitting: Cardiovascular Disease

## 2016-07-19 VITALS — BP 100/60 | HR 81 | Ht 67.0 in | Wt 147.8 lb

## 2016-07-19 DIAGNOSIS — I951 Orthostatic hypotension: Secondary | ICD-10-CM | POA: Diagnosis not present

## 2016-07-19 DIAGNOSIS — R000 Tachycardia, unspecified: Secondary | ICD-10-CM | POA: Diagnosis not present

## 2016-07-19 MED ORDER — PROPRANOLOL HCL 10 MG PO TABS: 10.0000 mg | ORAL_TABLET | Freq: Two times a day (BID) | ORAL | 10 refills | Status: DC

## 2016-07-19 NOTE — Patient Instructions (Signed)

## 2016-07-19 NOTE — Progress Notes (Signed)
Cardiology Office Note   Date:  07/19/2016   ID:  Diane Hanson, DOB 11-29-1995, MRN [id number]  PCP:  Levon HedgerEBBIE SCHOENHOFF, MD  Cardiologist:   Chilton Siiffany Kimball, MD   No chief complaint on file.   Patient ID: Diane Hanson is a 21 y.o. female with syncope who presents for follow up.   History of Present Illness:  [name] was seen in clinic in 12/2014 for an evaluation of syncope.  At that appointment she was noted to be on spironolactone for acne, which she was asked to stop due to concern for orthostasis.  She was referred for echo 01/2015 due to a heart murmur, which was unremarkable.  She also had a 14 day event monitor that showed sinus rhythm, sinus arrhythmia and sinus tachycardia.  She reported one event of heart fluttering and skipped beats, at which time her rhythm was sinus arrhythmia and sinus tachycardia.  She also reported chest pain and shortness of breath when her heart rhythm was 161 bpm. She continued to have dizziness after stopping spironolactone. She was started on Florinef 0.1 mg daily. Propranolol was stopped due to hypotension and because it was not helping. Her symptoms improved but were still present so Florinef was increased to 0.2 mg on 07/2015.  At her last appointment symptoms were better-controlled on florinef and propranolol.  However, she complained of excessive thirst. Florinef was switched to midodrine.  Since that time she has been doing well.  She is no longer thirsty.  She denies lightheadedness, dizziness, or palpitations.  She denies lower extremity edema, orthopnea or PND.    Past Medical History:  Diagnosis Date  . Orthostatic hypotension 07/11/2015  . Sinus arrhythmia 02/23/2015  . Syncope     Past Surgical History:  Procedure Laterality Date  . TONSILLECTOMY       Current Outpatient Prescriptions  Medication Sig Dispense Refill  . midodrine (PROAMATINE) 5 MG tablet Take 1 tablet (5 mg total) by mouth 3 (three) times daily with  meals. 90 tablet 6  . propranolol (INDERAL) 10 MG tablet Take 1 tablet (10 mg total) by mouth 2 (two) times daily. 60 tablet 1   No current facility-administered medications for this visit.     Allergies:   Patient has no known allergies.    Social History:  The patient  reports that she has never smoked. She has never used smokeless tobacco. She reports that she does not drink alcohol.   Family History:  The patient's Family history is unknown by patient.    ROS:  Please see the history of present illness.   Otherwise, review of systems are positive for none.   All other systems are reviewed and negative.    PHYSICAL EXAM: VS:  BP 100/60   Pulse 81   Ht 5\' 7"  (1.702 m)   Wt 67 kg (147 lb 12.8 oz)   LMP 07/05/2016 (Approximate)   SpO2 98%   BMI 23.15 kg/m  , BMI Body mass index is 23.15 kg/m.   GENERAL:  Well appearing HEENT:  Pupils equal round and reactive, fundi not visualized, oral mucosa unremarkable NECK:  No jugular venous distention, waveform within normal limits, carotid upstroke brisk and symmetric, no bruits LYMPHATICS:  No cervical adenopathy LUNGS:  Clear to auscultation bilaterally HEART:  RRR.  PMI not displaced or sustained,S1 and S2 within normal limits, no S3, no S4, no clicks, no rubs, no murmurs ABD:  Flat, positive bowel sounds normal in frequency in pitch,  no bruits, no rebound, no guarding, no midline pulsatile mass, no hepatomegaly, no splenomegaly EXT:  2 plus pulses throughout, no edema, no cyanosis no clubbing SKIN:  No rashes no nodules NEURO:  Cranial nerves II through XII grossly intact, motor grossly intact throughout PSYCH:  Cognitively intact, oriented to person place and time   EKG:  EKG is not ordered today.   03/27/16: Sinus arrhythmia rate 91 bpm.  Recent Labs: 03/08/2016: BUN 16; Creat 0.76; Magnesium 1.8; Potassium 4.4; Sodium 140    Lipid Panel No results found for: CHOL, TRIG, HDL, CHOLHDL, VLDL, LDLCALC, LDLDIRECT    Wt  Readings from Last 3 Encounters:  07/19/16 67 kg (147 lb 12.8 oz)  05/25/16 67.1 kg (148 lb)  05/16/16 66.9 kg (147 lb 6.4 oz)    Echo 02/01/15: Study Conclusions  - Left ventricle: The cavity size was normal. Wall thickness was normal. Systolic function was normal. The estimated ejection fraction was in the range of 55% to 60%. Wall motion was normal; there were no regional wall motion abnormalities. - Pulmonary arteries: Systolic pressure was mildly increased. PA peak pressure: 31 mm Hg (S).  14 Day Event Monitor 02/01/15:  Quality: Fair. Baseline artifact. Rhythms Recorded: sinus rhythm, sinus arrhythmia and sinus tachycardia There were no PACs or PVCs There were no pauses >2.5s  Patient did submit a symptom diary. At the time when heart fluttering and skipped beats were reported, the rhythm was sinus arrhythmia and sinus tachycardia, rate 123 bpm. She also reported chest pain and shortness of breath. At that time sinus tachycardia was noted at 161 bpm.  Other studies Reviewed: Additional studies/ records that were reviewed today include: . Review of the above records demonstrates:  Please see elsewhere in the note.     ASSESSMENT AND PLAN:  # Orthostatic hypotension:  # POTS: # Palpitations: Symptoms are better controlled on midodrine and propranolol.  She is no longer thirsty.  I recommended that she check her BP at least once per month to ensure that it isn't getting too high.    Current medicines are reviewed at length with the patient today.  The patient does not have concerns regarding medicines.  Labs/ tests ordered today include:   No orders of the defined types were placed in this encounter.   Disposition:   FU with Dr. Elmarie Shiley C. [place] in 6 months   Signed, Chilton Si, MD  07/19/2016 9:52 AM    [facility]

## 2016-08-16 DIAGNOSIS — R0981 Nasal congestion: Secondary | ICD-10-CM | POA: Diagnosis not present

## 2016-08-24 DIAGNOSIS — R05 Cough: Secondary | ICD-10-CM | POA: Diagnosis not present

## 2016-08-24 DIAGNOSIS — R490 Dysphonia: Secondary | ICD-10-CM | POA: Diagnosis not present

## 2016-08-24 DIAGNOSIS — J209 Acute bronchitis, unspecified: Secondary | ICD-10-CM | POA: Diagnosis not present

## 2016-10-11 DIAGNOSIS — R490 Dysphonia: Secondary | ICD-10-CM | POA: Diagnosis not present

## 2016-10-11 DIAGNOSIS — E739 Lactose intolerance, unspecified: Secondary | ICD-10-CM | POA: Diagnosis not present

## 2016-10-16 ENCOUNTER — Telehealth: Payer: Self-pay | Admitting: *Deleted

## 2016-10-16 NOTE — Telephone Encounter (Signed)
Patient scheduled to see Dr Duke Salviaandolph for 1 year follow up tomorrow Patient seen in March by Dr Duke Salviaandolph and was advised to follow up in 6 months Left message for patient to call back tomorrow, if no problems ok to just follow up in a few months as advised at visit

## 2016-10-17 ENCOUNTER — Ambulatory Visit (INDEPENDENT_AMBULATORY_CARE_PROVIDER_SITE_OTHER): Payer: BLUE CROSS/BLUE SHIELD | Admitting: Cardiovascular Disease

## 2016-10-17 ENCOUNTER — Encounter: Payer: Self-pay | Admitting: Cardiovascular Disease

## 2016-10-17 VITALS — BP 94/64 | HR 48 | Ht 67.0 in | Wt 145.0 lb

## 2016-10-17 DIAGNOSIS — R011 Cardiac murmur, unspecified: Secondary | ICD-10-CM | POA: Diagnosis not present

## 2016-10-17 DIAGNOSIS — I951 Orthostatic hypotension: Secondary | ICD-10-CM

## 2016-10-17 DIAGNOSIS — G90A Postural orthostatic tachycardia syndrome (POTS): Secondary | ICD-10-CM

## 2016-10-17 DIAGNOSIS — R000 Tachycardia, unspecified: Secondary | ICD-10-CM

## 2016-10-17 NOTE — Patient Instructions (Signed)

## 2016-10-17 NOTE — Telephone Encounter (Signed)
F/U call: Patient calling, states that she wanted to come in to visit today because she will be leaving the country for a few months and wanted to have a check-up before she left. Thanks.

## 2016-10-17 NOTE — Progress Notes (Signed)
Cardiology Office Note   Date:  10/17/2016   ID:  Diane Hanson, DOB 06/16/1995, MRN [id number]  PCP:  Diane RanchSchoenhoff, Deborah D, MD  Cardiologist:   Chilton Siiffany [place], MD   Chief Complaint  Patient presents with  . Follow-up    Pt states no Sx    Patient ID: Diane Hanson is a 21 y.o. female with syncope who presents for follow up.  History of Present Illness:  [name] was seen in clinic in 12/2014 for an evaluation of syncope.  At that appointment she was noted to be on spironolactone for acne, which she was asked to stop due to concern for orthostasis.  She was referred for echo 01/2015 due to a heart murmur, which was unremarkable.  She also had a 14 day event monitor that showed sinus rhythm, sinus arrhythmia and sinus tachycardia.  She reported one event of heart fluttering and skipped beats, at which time her rhythm was sinus arrhythmia and sinus tachycardia.  She also reported chest pain and shortness of breath when her heart rhythm was 161 bpm. She continued to have dizziness after stopping spironolactone. She was started on Florinef 0.1 mg daily. Propranolol was stopped due to hypotension and because it was not helping. Her symptoms improved but were still present so Florinef was increased to 0.2 mg on 07/2015.  Since her last appointment [name] has been doing well.  She has not palpitations, lightheadedness or dizziness.  She also denies lower extremity edema, orthopnea or PND.  She is getting ready to go to [place] for a [place].  She leaves next week.  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   Past Medical History:  Diagnosis Date  . Orthostatic hypotension 07/11/2015  . Sinus arrhythmia 02/23/2015  . Syncope     Past Surgical History:  Procedure Laterality Date  . TONSILLECTOMY       Current Outpatient Prescriptions  Medication Sig Dispense Refill  . midodrine (PROAMATINE) 5 MG tablet Take 1 tablet (5 mg total) by mouth 3 (three) times  daily with meals. 90 tablet 6  . propranolol (INDERAL) 10 MG tablet Take 1 tablet (10 mg total) by mouth 2 (two) times daily. 60 tablet 10   No current facility-administered medications for this visit.     Allergies:   Patient has no known allergies.    Social History:  The patient  reports that she has never smoked. She has never used smokeless tobacco. She reports that she does not drink alcohol.   Family History:  The patient's Family history is unknown by patient.    ROS:  Please see the history of present illness.   Otherwise, review of systems are positive for none.   All other systems are reviewed and negative.    PHYSICAL EXAM: VS:  BP 94/64   Pulse (!) 48   Ht 5\' 7"  (1.702 m)   Wt 65.8 kg (145 lb)   BMI 22.71 kg/m  , BMI Body mass index is 22.71 kg/m.   GENERAL:  Well appearing.  No acute distress HEENT:  Pupils equal round and reactive, fundi not visualized, oral mucosa unremarkable NECK:  No jugular venous distention, waveform within normal limits, carotid upstroke brisk and symmetric, no bruits LUNGS:  Clear to auscultation bilaterally.  No crackles, wheezes or rhonchi. HEART:  RRR.  PMI not displaced or sustained,S1 and S2 within normal limits, no S3, no S4, no clicks, no rubs, no murmurs ABD:  Flat, positive bowel sounds normal in frequency in pitch, no bruits, no rebound, no guarding, no midline pulsatile mass, no hepatomegaly, no splenomegaly EXT:  2 plus pulses throughout, no edema, no cyanosis no clubbing SKIN:  No rashes no nodules NEURO:  Cranial nerves II through XII grossly intact, motor grossly intact throughout PSYCH:  Cognitively intact, oriented to person place and time   EKG:  EKG is not ordered today.   03/27/16: Sinus arrhythmia rate 91 bpm.  Recent Labs: 03/08/2016: BUN 16; Creat 0.76; Magnesium 1.8; Potassium 4.4; Sodium 140    Lipid Panel No results found for: CHOL, TRIG, HDL, CHOLHDL, VLDL, LDLCALC, LDLDIRECT    Wt Readings from Last 3  Encounters:  10/17/16 65.8 kg (145 lb)  07/19/16 67 kg (147 lb 12.8 oz)  05/25/16 67.1 kg (148 lb)    Echo 02/01/15: Study Conclusions  - Left ventricle: The cavity size was normal. Wall thickness was normal. Systolic function was normal. The estimated ejection fraction was in the range of 55% to 60%. Wall motion was normal; there were no regional wall motion abnormalities. - Pulmonary arteries: Systolic pressure was mildly increased. PA peak pressure: 31 mm Hg (S).  14 Day Event Monitor 02/01/15:  Quality: Fair. Baseline artifact. Rhythms Recorded: sinus rhythm, sinus arrhythmia and sinus tachycardia There were no PACs or PVCs There were no pauses >2.5s  Patient did submit a symptom diary. At the time when heart fluttering and skipped beats were reported, the rhythm was sinus arrhythmia and sinus tachycardia, rate 123 bpm. She also reported chest pain and shortness of breath. At that time sinus tachycardia was noted at 161 bpm.  Other studies Reviewed: Additional studies/ records that were reviewed today include: . Review of the above records demonstrates:  Please see elsewhere in the note.     ASSESSMENT AND PLAN:  # Orthostatic hypotension:  # POTS: # Palpitations: Symptoms are well-controlled on midodrine and propranolol.  Continue midodrine and propranolol.  Heart rate and BP are low today but she is asymptomatic.  Given that she is going on a trip we will not make any changes at this time.  If she develops lightheadedness or dizziness she will reduce propranolol to 5 mg twice daily.   Current medicines are reviewed at length with the patient today.  The patient does not have concerns regarding medicines.  Labs/ tests ordered today include:   No orders of the defined types were placed in this encounter.   Disposition:   FU with Dr. Elmarie Shiley C. Duke Salvia in 1 year   Signed, Chilton Si, MD  10/17/2016 12:01 PM    [facility]

## 2016-11-22 DIAGNOSIS — R110 Nausea: Secondary | ICD-10-CM | POA: Diagnosis not present

## 2016-11-22 DIAGNOSIS — R069 Unspecified abnormalities of breathing: Secondary | ICD-10-CM | POA: Diagnosis not present

## 2016-11-22 DIAGNOSIS — R197 Diarrhea, unspecified: Secondary | ICD-10-CM | POA: Diagnosis not present

## 2017-01-08 ENCOUNTER — Telehealth: Payer: Self-pay | Admitting: Cardiovascular Disease

## 2017-01-08 NOTE — Telephone Encounter (Signed)
New Message  Pt call requesting to speak with RN. Pt states her my chart says its tome to schedule a f/u appt the patient chart states she isnt to come back until 10/2017. Pt would like to speak with RN. Please call back to discuss

## 2017-01-08 NOTE — Telephone Encounter (Signed)
Returned call, advised OK to return 1 yr from May visit per last OV note. Pt voiced understanding of instruction. Pt asked about low HR - states her HR was less than 50 today at PCP visit & wanted to see if this was a concern.  I reported her last HR at our office was 48, she was asymptomatic at that time, but Dr. Duke Salvia advised on this --  Per MD note 5/30: "Symptoms are well-controlled on midodrine and propranolol.  Continue midodrine and propranolol.  Heart rate and BP are low today but she is asymptomatic.  Given that she is going on a trip we will not make any changes at this time.  If she develops lightheadedness or dizziness she will reduce propranolol to 5 mg twice daily."  Pt has verbalized understanding of these instructions, and will call should she have symptoms indicating a need for medication changes.

## 2017-01-10 DIAGNOSIS — J069 Acute upper respiratory infection, unspecified: Secondary | ICD-10-CM | POA: Diagnosis not present

## 2017-01-15 DIAGNOSIS — R05 Cough: Secondary | ICD-10-CM | POA: Diagnosis not present

## 2017-01-15 DIAGNOSIS — J028 Acute pharyngitis due to other specified organisms: Secondary | ICD-10-CM | POA: Diagnosis not present

## 2017-02-26 ENCOUNTER — Other Ambulatory Visit: Payer: Self-pay | Admitting: Internal Medicine

## 2017-02-26 ENCOUNTER — Ambulatory Visit
Admission: RE | Admit: 2017-02-26 | Discharge: 2017-02-26 | Disposition: A | Discharge status: Home / Self Care | Payer: BLUE CROSS/BLUE SHIELD | Source: Ambulatory Visit | Attending: Internal Medicine | Admitting: Internal Medicine

## 2017-02-26 DIAGNOSIS — K5909 Other constipation: Secondary | ICD-10-CM

## 2017-02-26 DIAGNOSIS — K5900 Constipation, unspecified: Secondary | ICD-10-CM | POA: Diagnosis not present

## 2017-02-26 DIAGNOSIS — R110 Nausea: Secondary | ICD-10-CM

## 2017-02-28 ENCOUNTER — Ambulatory Visit
Admission: RE | Admit: 2017-02-28 | Discharge: 2017-02-28 | Disposition: A | Discharge status: Home / Self Care | Payer: BLUE CROSS/BLUE SHIELD | Source: Ambulatory Visit | Attending: Internal Medicine | Admitting: Internal Medicine

## 2017-02-28 DIAGNOSIS — R110 Nausea: Secondary | ICD-10-CM

## 2017-02-28 DIAGNOSIS — K5901 Slow transit constipation: Secondary | ICD-10-CM | POA: Diagnosis not present

## 2017-02-28 DIAGNOSIS — K7689 Other specified diseases of liver: Secondary | ICD-10-CM | POA: Diagnosis not present

## 2017-05-07 DIAGNOSIS — L700 Acne vulgaris: Secondary | ICD-10-CM | POA: Diagnosis not present

## 2017-05-24 DIAGNOSIS — K5901 Slow transit constipation: Secondary | ICD-10-CM | POA: Diagnosis not present

## 2017-05-24 DIAGNOSIS — R110 Nausea: Secondary | ICD-10-CM | POA: Diagnosis not present

## 2017-06-07 DIAGNOSIS — L700 Acne vulgaris: Secondary | ICD-10-CM | POA: Diagnosis not present

## 2017-07-12 DIAGNOSIS — Z79899 Other long term (current) drug therapy: Secondary | ICD-10-CM | POA: Diagnosis not present

## 2017-07-12 DIAGNOSIS — L700 Acne vulgaris: Secondary | ICD-10-CM | POA: Diagnosis not present

## 2017-07-31 DIAGNOSIS — J029 Acute pharyngitis, unspecified: Secondary | ICD-10-CM | POA: Diagnosis not present

## 2017-08-09 DIAGNOSIS — L700 Acne vulgaris: Secondary | ICD-10-CM | POA: Diagnosis not present

## 2017-08-09 DIAGNOSIS — Z79899 Other long term (current) drug therapy: Secondary | ICD-10-CM | POA: Diagnosis not present

## 2017-08-21 DIAGNOSIS — L308 Other specified dermatitis: Secondary | ICD-10-CM | POA: Diagnosis not present

## 2017-08-21 DIAGNOSIS — L700 Acne vulgaris: Secondary | ICD-10-CM | POA: Diagnosis not present

## 2017-09-10 DIAGNOSIS — L700 Acne vulgaris: Secondary | ICD-10-CM | POA: Diagnosis not present

## 2017-09-10 DIAGNOSIS — Z79899 Other long term (current) drug therapy: Secondary | ICD-10-CM | POA: Diagnosis not present

## 2017-10-04 DIAGNOSIS — L700 Acne vulgaris: Secondary | ICD-10-CM | POA: Diagnosis not present

## 2017-10-04 DIAGNOSIS — L308 Other specified dermatitis: Secondary | ICD-10-CM | POA: Diagnosis not present

## 2017-10-04 DIAGNOSIS — Z79899 Other long term (current) drug therapy: Secondary | ICD-10-CM | POA: Diagnosis not present

## 2017-10-11 ENCOUNTER — Encounter: Payer: Self-pay | Admitting: Cardiovascular Disease

## 2017-10-16 DIAGNOSIS — Z79899 Other long term (current) drug therapy: Secondary | ICD-10-CM | POA: Diagnosis not present

## 2017-10-17 ENCOUNTER — Telehealth: Payer: Self-pay | Admitting: *Deleted

## 2017-10-17 DIAGNOSIS — R011 Cardiac murmur, unspecified: Secondary | ICD-10-CM | POA: Diagnosis not present

## 2017-10-17 DIAGNOSIS — Z0000 Encounter for general adult medical examination without abnormal findings: Secondary | ICD-10-CM | POA: Diagnosis not present

## 2017-10-17 DIAGNOSIS — R002 Palpitations: Secondary | ICD-10-CM | POA: Diagnosis not present

## 2017-10-17 DIAGNOSIS — I951 Orthostatic hypotension: Secondary | ICD-10-CM

## 2017-10-17 MED ORDER — MIDODRINE HCL 5 MG PO TABS: 5.0000 mg | ORAL_TABLET | Freq: Three times a day (TID) | ORAL | 0 refills | Status: DC

## 2017-10-17 MED ORDER — PROPRANOLOL HCL 10 MG PO TABS: 10.0000 mg | ORAL_TABLET | Freq: Two times a day (BID) | ORAL | 0 refills | Status: DC

## 2017-10-17 NOTE — Telephone Encounter (Signed)
Patient has follow up scheduled, refills for medications sent to pharmacy as requested

## 2017-11-04 ENCOUNTER — Other Ambulatory Visit (HOSPITAL_COMMUNITY)
Admission: RE | Admit: 2017-11-04 | Discharge: 2017-11-04 | Disposition: A | Discharge status: Home / Self Care | Payer: BLUE CROSS/BLUE SHIELD | Source: Ambulatory Visit | Attending: Internal Medicine | Admitting: Internal Medicine

## 2017-11-04 ENCOUNTER — Other Ambulatory Visit: Payer: Self-pay | Admitting: Internal Medicine

## 2017-11-04 DIAGNOSIS — Z01419 Encounter for gynecological examination (general) (routine) without abnormal findings: Secondary | ICD-10-CM | POA: Insufficient documentation

## 2017-11-06 LAB — CYTOLOGY - PAP: DIAGNOSIS: NEGATIVE

## 2017-11-15 DIAGNOSIS — L700 Acne vulgaris: Secondary | ICD-10-CM | POA: Diagnosis not present

## 2017-11-15 DIAGNOSIS — Z79899 Other long term (current) drug therapy: Secondary | ICD-10-CM | POA: Diagnosis not present

## 2017-11-27 DIAGNOSIS — Z79899 Other long term (current) drug therapy: Secondary | ICD-10-CM | POA: Diagnosis not present

## 2017-12-25 ENCOUNTER — Encounter: Payer: Self-pay | Admitting: Cardiovascular Disease

## 2017-12-25 ENCOUNTER — Ambulatory Visit: Payer: BLUE CROSS/BLUE SHIELD | Admitting: Cardiovascular Disease

## 2017-12-25 VITALS — BP 112/72 | HR 72 | Ht 67.0 in | Wt 144.0 lb

## 2017-12-25 DIAGNOSIS — I951 Orthostatic hypotension: Secondary | ICD-10-CM

## 2017-12-25 DIAGNOSIS — G90A Postural orthostatic tachycardia syndrome (POTS): Secondary | ICD-10-CM

## 2017-12-25 DIAGNOSIS — R000 Tachycardia, unspecified: Secondary | ICD-10-CM | POA: Diagnosis not present

## 2017-12-25 LAB — BASIC METABOLIC PANEL
BUN / CREAT RATIO: 17 (ref 9–23)
BUN: 13 mg/dL (ref 6–20)
CHLORIDE: 103 mmol/L (ref 96–106)
CO2: 26 mmol/L (ref 20–29)
Calcium: 9.6 mg/dL (ref 8.7–10.2)
Creatinine, Ser: 0.77 mg/dL (ref 0.57–1.00)
GFR, EST AFRICAN AMERICAN: 127 mL/min/{1.73_m2} (ref >59)
GFR, EST NON AFRICAN AMERICAN: 110 mL/min/{1.73_m2} (ref >59)
Glucose: 81 mg/dL (ref 65–99)
POTASSIUM: 5 mmol/L (ref 3.5–5.2)
SODIUM: 141 mmol/L (ref 134–144)

## 2017-12-25 LAB — TSH: TSH: 1.88 u[IU]/mL (ref 0.450–4.500)

## 2017-12-25 LAB — MAGNESIUM: Magnesium: 2.1 mg/dL (ref 1.6–2.3)

## 2017-12-25 MED ORDER — MIDODRINE HCL 5 MG PO TABS: ORAL_TABLET | ORAL | 3 refills | Status: DC

## 2017-12-25 MED ORDER — PROPRANOLOL HCL 10 MG PO TABS: 10.0000 mg | ORAL_TABLET | Freq: Two times a day (BID) | ORAL | 3 refills | Status: AC

## 2017-12-25 NOTE — Progress Notes (Signed)
Cardiology Office Note   Date:  12/25/2017   ID:  Diane Hanson, DOB [birth year], MRN [id number]  PCP:  Diane Ranch, MD  Cardiologist:   Diane Si, MD   Chief Complaint  Patient presents with  . Follow-up    1 year.    Patient ID: Diane Hanson is a 22 y.o. female with syncope who presents for follow up.  History of Present Illness:  Diane Hanson was seen in clinic in 12/2014 for an evaluation of syncope.  At that appointment she was noted to be on spironolactone for acne, which she was asked to stop due to concern for orthostasis.  She was referred for echo 01/2015 due to a heart murmur, which was unremarkable.  She also had a 14 day event monitor that showed sinus rhythm, sinus arrhythmia and sinus tachycardia.  She reported one event of heart fluttering and skipped beats, at which time her rhythm was sinus arrhythmia and sinus tachycardia.  She also reported chest pain and shortness of breath when her heart rhythm was 161 bpm. She continued to have dizziness after stopping spironolactone. She was started on Florinef 0.1 mg daily. Propranolol was stopped due to hypotension and because it was not helping. Her symptoms improved but were still present so Florinef was increased to 0.2 mg on 07/2015.  Florinef was switched to midodrine.  Since her last appointment Diane Hanson has been doing well.  The only time she gets lightheaded is when she gets out of the shower in the mornings.  She had a couple episodes of near syncope in the setting.  She has no lightheadedness upon standing in general.  She also denies any palpitations.  She has not had any lower extremity edema, orthopnea, or PND.  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  She exercises by running on the treadmill approximately 3 days a week for 15 to 20 minutes.  She has no exertional symptoms.    Past Medical History:  Diagnosis Date  . Orthostatic  hypotension 07/11/2015  . Sinus arrhythmia 02/23/2015  . Syncope     Past Surgical History:  Procedure Laterality Date  . TONSILLECTOMY       Current Outpatient Medications  Medication Sig Dispense Refill  . midodrine (PROAMATINE) 5 MG tablet TAKE 2 TABLETS IN THE MORNING, 1 TABLET MID-DAY, AND 1 TABLET IN THE EVENING 360 tablet 3  . propranolol (INDERAL) 10 MG tablet Take 1 tablet (10 mg total) by mouth 2 (two) times daily. 180 tablet 3   No current facility-administered medications for this visit.     Allergies:   Patient has no known allergies.    Social History:  The patient  reports that she has never smoked. She has never used smokeless tobacco. She reports that she does not drink alcohol.   Family History:  The patient's Family history is unknown by patient.    ROS:  Please see the history of present illness.   Otherwise, review of systems are positive for none.   All other systems are reviewed and negative.    PHYSICAL EXAM: VS:  BP 112/72 (BP Location: Left Arm, Patient Position: Sitting, Cuff Size: Normal)   Pulse 72   Ht 5\' 7"  (1.702 m)   Wt 144 lb (65.3 kg)   BMI 22.55 kg/m  , BMI Body mass index is 22.55 kg/m. GENERAL:  Well appearing HEENT: Pupils equal round and  reactive, fundi not visualized, oral mucosa unremarkable NECK:  No jugular venous distention, waveform within normal limits, carotid upstroke brisk and symmetric, no bruits, no thyromegaly LYMPHATICS:  No cervical adenopathy LUNGS:  Clear to auscultation bilaterally HEART:  Irregularly irregular.   PMI not displaced or sustained,S1 and S2 within normal limits, no S3, no S4, no clicks, no rubs, no murmurs ABD:  Flat, positive bowel sounds normal in frequency in pitch, no bruits, no rebound, no guarding, no midline pulsatile mass, no hepatomegaly, no splenomegaly EXT:  2 plus pulses throughout, no edema, no cyanosis no clubbing SKIN:  No rashes no nodules NEURO:  Cranial nerves II through XII grossly  intact, motor grossly intact throughout PSYCH:  Cognitively intact, oriented to person place and time   EKG:  EKG is ordered today.   03/27/16: Sinus arrhythmia rate 91 bpm. 12/26/27: Sinus arrhythmia.  Rate 72 bpm.  Recent Labs: No results found for requested labs within last 8760 hours.    Lipid Panel No results found for: CHOL, TRIG, HDL, CHOLHDL, VLDL, LDLCALC, LDLDIRECT    Wt Readings from Last 3 Encounters:  12/25/17 144 lb (65.3 kg)  10/17/16 145 lb (65.8 kg)  07/19/16 147 lb 12.8 oz (67 kg)    Echo 02/01/15: Study Conclusions  - Left ventricle: The cavity size was normal. Wall thickness was normal. Systolic function was normal. The estimated ejection fraction was in the range of 55% to 60%. Wall motion was normal; there were no regional wall motion abnormalities. - Pulmonary arteries: Systolic pressure was mildly increased. PA peak pressure: 31 mm Hg (S).  14 Day Event Monitor 02/01/15:  Quality: Fair. Baseline artifact. Rhythms Recorded: sinus rhythm, sinus arrhythmia and sinus tachycardia There were no PACs or PVCs There were no pauses >2.5s  Patient did submit a symptom diary. At the time when heart fluttering and skipped beats were reported, the rhythm was sinus arrhythmia and sinus tachycardia, rate 123 bpm. She also reported chest pain and shortness of breath. At that time sinus tachycardia was noted at 161 bpm.  Other studies Reviewed: Additional studies/ records that were reviewed today include: . Review of the above records demonstrates:  Please see elsewhere in the note.     ASSESSMENT AND PLAN:  # Orthostatic hypotension:  # POTS: # Palpitations: Symptoms are well-controlled on midodrine and propranolol.  She does have some lightheadedness upon getting out of the shower.  She already takes her midodrine before she gets into the shower.  I recommended that she increase her morning dose to 10 mg and keep the other 2 doses at 5 mg.  We will  check a basic metabolic panel, magnesium, and TSH today.  She is getting ready to go out of town for school so she will need 562-month supplies.  She will likely have her medications filled at student health while in [place].   Current medicines are reviewed at length with the patient today.  The patient does not have concerns regarding medicines.  Labs/ tests ordered today include:   Orders Placed This Encounter  Procedures  . TSH  . Basic metabolic panel  . Magnesium    Disposition:   FU with Dr. Elmarie Shileyiffany C. Duke Salviaandolph in 1 year   Signed, Diane Siiffany [place], MD  12/25/2017 9:56 AM    [facility]

## 2017-12-25 NOTE — Addendum Note (Signed)
Addended by: Regis BillPRATT, Shambhavi Salley B on: 12/25/2017 01:40 PM   Modules accepted: Orders

## 2017-12-25 NOTE — Patient Instructions (Signed)
Medication Instructions:  INCREASE YOUR MIDODRINE TO 10 MG IN THE MORNING AND 5 MG MID-DAY AND EVENING   Labwork: BMET/MAGNESIUM/TSH TODAY   Testing/Procedures: NONE  Follow-Up: Your physician wants you to follow-up in: 1 YEAR  You will receive a reminder letter in the mail two months in advance. If you don't receive a letter, please call our office to schedule the follow-up appointment.   If you need a refill on your cardiac medications before your next appointment, please call your pharmacy.

## 2018-01-02 DIAGNOSIS — L700 Acne vulgaris: Secondary | ICD-10-CM | POA: Diagnosis not present

## 2018-01-02 DIAGNOSIS — Z79899 Other long term (current) drug therapy: Secondary | ICD-10-CM | POA: Diagnosis not present

## 2018-03-26 ENCOUNTER — Other Ambulatory Visit: Payer: Self-pay | Admitting: Internal Medicine

## 2018-03-26 DIAGNOSIS — K7689 Other specified diseases of liver: Secondary | ICD-10-CM

## 2018-05-15 ENCOUNTER — Other Ambulatory Visit: Payer: BLUE CROSS/BLUE SHIELD

## 2018-05-16 ENCOUNTER — Ambulatory Visit
Admission: RE | Admit: 2018-05-16 | Discharge: 2018-05-16 | Disposition: A | Discharge status: Home / Self Care | Payer: BLUE CROSS/BLUE SHIELD | Source: Ambulatory Visit | Attending: Internal Medicine | Admitting: Internal Medicine

## 2018-05-16 DIAGNOSIS — K7689 Other specified diseases of liver: Secondary | ICD-10-CM

## 2018-05-22 ENCOUNTER — Encounter (INDEPENDENT_AMBULATORY_CARE_PROVIDER_SITE_OTHER): Payer: Self-pay | Admitting: Physician Assistant

## 2018-05-22 ENCOUNTER — Ambulatory Visit (INDEPENDENT_AMBULATORY_CARE_PROVIDER_SITE_OTHER): Payer: Self-pay | Admitting: Physician Assistant

## 2018-05-22 ENCOUNTER — Ambulatory Visit (INDEPENDENT_AMBULATORY_CARE_PROVIDER_SITE_OTHER): Payer: Self-pay

## 2018-05-22 ENCOUNTER — Ambulatory Visit (INDEPENDENT_AMBULATORY_CARE_PROVIDER_SITE_OTHER): Payer: BLUE CROSS/BLUE SHIELD | Admitting: Physician Assistant

## 2018-05-22 DIAGNOSIS — M25561 Pain in right knee: Secondary | ICD-10-CM | POA: Diagnosis not present

## 2018-05-22 NOTE — Progress Notes (Signed)
   Office Visit Note   Patient: Diane Hanson           Date of Birth: 1995/08/22           MRN: [id number] Visit Date: 05/22/2018              Requested by: Kendrick Ranch, MD [address] [place], [place] [address] PCP: Kendrick Ranch, MD   Assessment & Plan: Visit Diagnoses:  1. Acute pain of right knee     Plan: Impression is right knee contusion.  We will apply mupirocin to the superficial abrasion.  She will continue with this until healed.  She will continue to ice and elevate for pain and swelling.  Advance with activity as tolerated.  She does note that she leaves to head back to school in [place] on 05/31/2017.  If she has continued symptoms after that, she will follow-up with someone in [place].  Call with concerns or questions in the meantime.  Follow-Up Instructions: Return if symptoms worsen or fail to improve.   Orders:  Orders Placed This Encounter  Procedures  . XR Knee Complete 4 Views Right   No orders of the defined types were placed in this encounter.     Procedures: No procedures performed   Clinical Data: No additional findings.   Subjective: Chief Complaint  Patient presents with  . Right Knee - Pain    HPI patient is a pleasant 23 year old female who presents to our clinic today following an injury to her right knee.  She sustained this on 05/18/2018 when slipping in the rain falling on the anterior aspect of her knee.  She has noticed moderate pain to the patella since.  Pain is worse with ambulation.  She has been using ice and Tylenol with mild relief of symptoms.  Review of Systems as detailed in HPI.  All others reviewed and are negative.   Objective: Vital Signs: There were no vitals taken for this visit.  Physical Exam well-developed well-nourished female in no acute distress.  Alert and oriented x3.  Ortho Exam examination of her right knee reveals a trace effusion.  Range of motion 0 to 100 degrees.  No  joint line tenderness.  She does have a superficial abrasion about 2.5 x 2.5 cm to the patella.  Moderate ecchymosis throughout.  No patellofemoral crepitus.  Cruciates and collaterals are stable.  Specialty Comments:  No specialty comments available.  Imaging: Xr Knee Complete 4 Views Right  Result Date: 05/22/2018 No acute or structural abnormalities    PMFS History: Patient Active Problem List   Diagnosis Date Noted  . Acute pain of right knee 05/22/2018  . Orthostatic hypotension 07/11/2015  . Palpitations 05/02/2015  . Sinus arrhythmia 02/23/2015  . Syncope    Past Medical History:  Diagnosis Date  . Orthostatic hypotension 07/11/2015  . Sinus arrhythmia 02/23/2015  . Syncope     Family History  Family history unknown: Yes    Past Surgical History:  Procedure Laterality Date  . TONSILLECTOMY     Social History   Occupational History  . Not on file  Tobacco Use  . Smoking status: Never Smoker  . Smokeless tobacco: Never Used  Substance and Sexual Activity  . Alcohol use: No  . Drug use: Not on file  . Sexual activity: Not on file

## 2018-05-23 ENCOUNTER — Ambulatory Visit (INDEPENDENT_AMBULATORY_CARE_PROVIDER_SITE_OTHER): Payer: Self-pay | Admitting: Family Medicine

## 2018-05-23 DIAGNOSIS — K7689 Other specified diseases of liver: Secondary | ICD-10-CM | POA: Diagnosis not present

## 2018-05-26 ENCOUNTER — Other Ambulatory Visit: Payer: Self-pay | Admitting: Internal Medicine

## 2018-05-26 DIAGNOSIS — K7689 Other specified diseases of liver: Secondary | ICD-10-CM

## 2018-05-27 ENCOUNTER — Other Ambulatory Visit: Payer: Self-pay | Admitting: Internal Medicine

## 2018-05-27 ENCOUNTER — Ambulatory Visit
Admission: RE | Admit: 2018-05-27 | Discharge: 2018-05-27 | Disposition: A | Discharge status: Home / Self Care | Payer: BLUE CROSS/BLUE SHIELD | Source: Ambulatory Visit | Attending: Internal Medicine | Admitting: Internal Medicine

## 2018-05-27 ENCOUNTER — Inpatient Hospital Stay: Admission: RE | Admit: 2018-05-27 | Payer: Self-pay | Source: Ambulatory Visit

## 2018-05-27 DIAGNOSIS — K7689 Other specified diseases of liver: Secondary | ICD-10-CM | POA: Diagnosis not present

## 2018-05-27 MED ORDER — GADOXETATE DISODIUM 0.25 MMOL/ML IV SOLN
6.0000 mL | Freq: Once | INTRAVENOUS | Status: AC | PRN
  Administered 2018-05-27: 6 mL via INTRAVENOUS

## 2018-09-10 DIAGNOSIS — L700 Acne vulgaris: Secondary | ICD-10-CM | POA: Diagnosis not present

## 2018-10-03 ENCOUNTER — Telehealth: Payer: Self-pay

## 2018-10-03 ENCOUNTER — Telehealth (INDEPENDENT_AMBULATORY_CARE_PROVIDER_SITE_OTHER): Payer: BLUE CROSS/BLUE SHIELD | Admitting: Physician Assistant

## 2018-10-03 ENCOUNTER — Telehealth: Payer: Self-pay | Admitting: Cardiovascular Disease

## 2018-10-03 VITALS — BP 103/73 | HR 60 | Ht 67.0 in | Wt 143.0 lb

## 2018-10-03 DIAGNOSIS — R55 Syncope and collapse: Secondary | ICD-10-CM | POA: Diagnosis not present

## 2018-10-03 DIAGNOSIS — I951 Orthostatic hypotension: Secondary | ICD-10-CM | POA: Diagnosis not present

## 2018-10-03 DIAGNOSIS — Z79899 Other long term (current) drug therapy: Secondary | ICD-10-CM | POA: Diagnosis not present

## 2018-10-03 DIAGNOSIS — R002 Palpitations: Secondary | ICD-10-CM

## 2018-10-03 MED ORDER — MIDODRINE HCL 5 MG PO TABS: ORAL_TABLET | ORAL | 3 refills | Status: AC

## 2018-10-03 NOTE — Telephone Encounter (Signed)
Virtual Visit Pre-Appointment Phone Call  "(Name), I am calling you today to discuss your upcoming appointment. We are currently trying to limit exposure to the virus that causes COVID-19 by seeing patients at home rather than in the office."  1. "What is the BEST phone number to call the day of the visit?" - include this in appointment notes  2. "Do you have or have access to (through a family member/friend) a smartphone with video capability that we can use for your visit?" a. If yes - list this number in appt notes as "cell" (if different from BEST phone #) and list the appointment type as a VIDEO visit in appointment notes b. If no - list the appointment type as a PHONE visit in appointment notes  3. Confirm consent - "In the setting of the current Covid19 crisis, you are scheduled for a VIDEO visit with your provider on 10/03/2018 at 3:00PM.  Just as we do with many in-office visits, in order for you to participate in this visit, we must obtain consent.  If you'd like, I can send this to your mychart (if signed up) or email for you to review.  Otherwise, I can obtain your verbal consent now.  All virtual visits are billed to your insurance company just like a normal visit would be.  By agreeing to a virtual visit, we'd like you to understand that the technology does not allow for your provider to perform an examination, and thus may limit your provider's ability to fully assess your condition. If your provider identifies any concerns that need to be evaluated in person, we will make arrangements to do so.  Finally, though the technology is pretty good, we cannot assure that it will always work on either your or our end, and in the setting of a video visit, we may have to convert it to a phone-only visit.  In either situation, we cannot ensure that we have a secure connection.  Are you willing to proceed?" STAFF: Did the patient verbally acknowledge consent to telehealth visit? Document YES/NO  here: YES  4. Advise patient to be prepared - "Two hours prior to your appointment, go ahead and check your blood pressure, pulse, oxygen saturation, and your weight (if you have the equipment to check those) and write them all down. When your visit starts, your provider will ask you for this information. If you have an Apple Watch or Kardia device, please plan to have heart rate information ready on the day of your appointment. Please have a pen and paper handy nearby the day of the visit as well."  5. Give patient instructions for MyChart download to smartphone OR [email or web address] as below if video visit (depending on what platform provider is using)  6. Inform patient they will receive a phone call 15 minutes prior to their appointment time (may be from unknown caller ID) so they should be prepared to answer    TELEPHONE CALL NOTE  Diane Hanson has been deemed a candidate for a follow-up tele-health visit to limit community exposure during the Covid-19 pandemic. I spoke with the patient via phone to ensure availability of phone/video source, confirm preferred email & phone number, and discuss instructions and expectations.  I reminded Diane Hanson to be prepared with any vital sign and/or heart rhythm information that could potentially be obtained via home monitoring, at the time of her visit. I reminded Diane Hanson to expect a phone call prior to her visit.  Dorris Fetch, CMA 10/03/2018 2:42 PM   INSTRUCTIONS FOR DOWNLOADING THE MYCHART APP TO SMARTPHONE  - The patient must first make sure to have activated MyChart and know their login information - If Apple, go to [employer] and type in MyChart in the search bar and download the app. If Android, ask patient to go to [employer] and type in [place] in the search bar and download the app. The app is free but as with any other app downloads, their phone may require them to verify saved payment information or  Apple/Android password.  - The patient will need to then log into the app with their MyChart username and password, and select [facility] as their healthcare provider to link the account. When it is time for your visit, go to the MyChart app, find appointments, and click Begin Video Visit. Be sure to Select Allow for your device to access the Microphone and Camera for your visit. You will then be connected, and your provider will be with you shortly.  **If they have any issues connecting, or need assistance please contact MyChart service desk [phone])**  **If using a computer, in order to ensure the best quality for their visit they will need to use either of the following Internet Browsers: [employer] or Google Chrome**  IF USING DOXIMITY or DOXY.ME - The patient will receive a link just prior to their visit by text.     FULL LENGTH CONSENT FOR TELE-HEALTH VISIT   I hereby voluntarily request, consent and authorize [facility] and its employed or contracted physicians, physician assistants, nurse practitioners or other licensed health care professionals (the Practitioner), to provide me with telemedicine health care services (the "Services") as deemed necessary by the treating Practitioner. I acknowledge and consent to receive the Services by the Practitioner via telemedicine. I understand that the telemedicine visit will involve communicating with the Practitioner through live audiovisual communication technology and the disclosure of certain medical information by electronic transmission. I acknowledge that I have been given the opportunity to request an in-person assessment or other available alternative prior to the telemedicine visit and am voluntarily participating in the telemedicine visit.  I understand that I have the right to withhold or withdraw my consent to the use of telemedicine in the course of my care at any time, without affecting my right to future care  or treatment, and that the Practitioner or I may terminate the telemedicine visit at any time. I understand that I have the right to inspect all information obtained and/or recorded in the course of the telemedicine visit and may receive copies of available information for a reasonable fee.  I understand that some of the potential risks of receiving the Services via telemedicine include:  Marland Kitchen Delay or interruption in medical evaluation due to technological equipment failure or disruption; . Information transmitted may not be sufficient (e.g. poor resolution of images) to allow for appropriate medical decision making by the Practitioner; and/or  . In rare instances, security protocols could fail, causing a breach of personal health information.  Furthermore, I acknowledge that it is my responsibility to provide information about my medical history, conditions and care that is complete and accurate to the best of my ability. I acknowledge that Practitioner's advice, recommendations, and/or decision may be based on factors not within their control, such as incomplete or inaccurate data provided by me or distortions of diagnostic images or specimens that may result from electronic transmissions. I understand that the  practice of medicine is not an Chief Strategy Officer and that Practitioner makes no warranties or guarantees regarding treatment outcomes. I acknowledge that I will receive a copy of this consent concurrently upon execution via email to the email address I last provided but may also request a printed copy by calling the office of [place].    I understand that my insurance will be billed for this visit.   I have read or had this consent read to me. . I understand the contents of this consent, which adequately explains the benefits and risks of the Services being provided via telemedicine.  . I have been provided ample opportunity to ask questions regarding this consent and the Services and have had  my questions answered to my satisfaction. . I give my informed consent for the services to be provided through the use of telemedicine in my medical care  By participating in this telemedicine visit I agree to the above.

## 2018-10-03 NOTE — Progress Notes (Signed)
Virtual Visit via Video Note   This visit type was conducted due to national recommendations for restrictions regarding the COVID-19 Pandemic (e.g. social distancing) in an effort to limit this patient's exposure and mitigate transmission in our community.  Due to her co-morbid illnesses, this patient is at least at moderate risk for complications without adequate follow up.  This format is felt to be most appropriate for this patient at this time.  All issues noted in this document were discussed and addressed.  A limited physical exam was performed with this format.  Please refer to the patient's chart for her consent to telehealth for [facility].   Date:  10/03/2018   ID:  Diane Hanson, DOB [birth year], MRN [id number]  Patient Location: Home Provider Location: Home  PCP:  Kendrick Ranch, MD  Cardiologist:  Chilton Si, MD  Electrophysiologist:  None   Evaluation Performed:  Follow-Up Visit  Chief Complaint:  dizziness  History of Present Illness:    Diane Hanson is a 23 y.o. female with past medical history of orthostatic hypotension, sinus arrhythmia and syncope.  Patient was initially referred to Dr. Duke Salvia in August 2016 for evaluation of syncope.  She was noted to be on spironolactone for acne but she was asked to stop due to concern of orthostasis.  Echocardiogram obtained in September 2016 was unremarkable.  A 14-day event monitor showed sinus rhythm, sinus arrhythmia and a sinus tachycardia.  She did report event of heart fluttering while on the monitor, this corresponded to sinus arrhythmia and sinus tachycardia.  She continued to have dizziness after stopping spironolactone and was started on Florinef 0.1 mg daily.  Propranolol was stopped due to hypotension and that it was not helping her symptoms.  Florinef was eventually switched to Midrin.  Propranolol was restarted.  Her last office visit with Dr. Duke Salvia was in August 2019, she was  complaining of lightheadedness upon getting out of the shower in the morning.  She was instructed to increase her morning dose of Midrin to 10 mg and that keeps her 2 other doses of 5 mg you order to prevent a sudden drop in the blood pressure.  TSH was normal at the time.  Electrolyte was also normal.  She had a abdominal ultrasound in December 2019 which showed stable septated cyst in the posterior right lobe of the liver.  This was reconfirmed on MRI of abdomen.  Patient was contacted today via doximity video conference visit.  In the past 3 days, she has been noticing increasing episodes of dizziness.  This is clearly orthostatic tilt based on her description.  She did notice the symptom every time she change body position.  She also has been noticing some increased palpitation as well and that this palpitation only last seconds at a time.  She denies any frank syncope recently.  She drinks about a cup of coffee and does not drink any alcohol.  Previous TSH in August of last year was normal.  I recommended she cut back on caffeine.  As for her dizziness, I recommended she eat more salty food to manually increase her blood pressure.  I will also increase her Midrin around noon to 10 mg.  Her current regimen of Midrin is 10 mg/10 mg / 5 mg.  Increasing Midrin is not ideal solution as it contradicts the effect of the propranolol.  However her current condition is interfering with her lifestyle.  I will obtain CBC and basic metabolic panel on follow-up.  I did not order a repeat TSH since she had a TSH this past year.  If her symptoms still persist, will need to discuss abdominal binder and compression stocking.  Otherwise, she will need 2 weeks in clinic visit with orthostatic vital signs for follow-up.  The patient does not have symptoms concerning for COVID-19 infection (fever, chills, cough, or new shortness of breath).    Past Medical History:  Diagnosis Date   Orthostatic hypotension 07/11/2015    Sinus arrhythmia 02/23/2015   Syncope    Past Surgical History:  Procedure Laterality Date   TONSILLECTOMY       Current Meds  Medication Sig   doxycycline (VIBRAMYCIN) 100 MG capsule 2 (two) times a day.   midodrine (PROAMATINE) 5 MG tablet TAKE 2 TABLETS IN THE MORNING, 1 TABLET MID-DAY, AND 1 TABLET IN THE EVENING   propranolol (INDERAL) 10 MG tablet Take 1 tablet (10 mg total) by mouth 2 (two) times daily.     Allergies:   Patient has no known allergies.   Social History   Tobacco Use   Smoking status: Never Smoker   Smokeless tobacco: Never Used  Substance Use Topics   Alcohol use: No   Drug use: Not on file     Family Hx: The patient's Family history is unknown by patient.  ROS:   Please see the history of present illness.     All other systems reviewed and are negative.   Prior CV studies:   The following studies were reviewed today:  Echo 02/01/2015 LV EF: 55% -   60%  Study Conclusions  - Left ventricle: The cavity size was normal. Wall thickness was   normal. Systolic function was normal. The estimated ejection   fraction was in the range of 55% to 60%. Wall motion was normal;   there were no regional wall motion abnormalities. - Pulmonary arteries: Systolic pressure was mildly increased. PA   peak pressure: 31 mm Hg (S).  Impressions:  - Normal LV function; mildly elevated puilmonary pressure.   Labs/Other Tests and Data Reviewed:    EKG:  An ECG dated 12/25/2017 was personally reviewed today and demonstrated:  Normal sinus rhythm  Recent Labs: 12/25/2017: BUN 13; Creatinine, Ser 0.77; Magnesium 2.1; Potassium 5.0; Sodium 141; TSH 1.880   Recent Lipid Panel No results found for: CHOL, TRIG, HDL, CHOLHDL, LDLCALC, LDLDIRECT  Wt Readings from Last 3 Encounters:  10/03/18 143 lb (64.9 kg)  12/25/17 144 lb (65.3 kg)  10/17/16 145 lb (65.8 kg)     Objective:    Vital Signs:  BP 103/73    Pulse 60    Ht 5\' 7"  (1.702 m)    Wt 143 lb  (64.9 kg)    BMI 22.40 kg/m    VITAL SIGNS:  reviewed  ASSESSMENT & PLAN:    1. Orthostatic dizziness: We will increase midodrine to 10/10/5 mg TID.  Increase salt intake and decrease caffeine intake.  2. History of syncope: No recent recurrence 3. Palpitation: Continue on current dose of propranolol  COVID-19 Education: The signs and symptoms of COVID-19 were discussed with the patient and how to seek care for testing (follow up with PCP or arrange E-visit).  The importance of social distancing was discussed today.  Time:   Today, I have spent 12 minutes with the patient with telehealth technology discussing the above problems.     Medication Adjustments/Labs and Tests Ordered: Current medicines are reviewed at length with the patient today.  Concerns  regarding medicines are outlined above.   Tests Ordered: No orders of the defined types were placed in this encounter.   Medication Changes: No orders of the defined types were placed in this encounter.   Disposition:  Follow up in 2 week(s)  Signed, Azalee Course, PA  10/03/2018 3:04 PM    [facility]

## 2018-10-03 NOTE — Telephone Encounter (Signed)
New message    Pt c/o Syncope: STAT if syncope occurred within 30 minutes and pt complains of lightheadedness High Priority if episode of passing out, completely, today or in last 24 hours   Did you pass out today? No   1. When is the last time you passed out? Patient states that she felt like she was going to pass out yesterday and the day before.  2. Has this occurred multiple times?Yes   3. Did you have any symptoms prior to passing out? Lightheaded, Dizzy, Palpitations, in a fog

## 2018-10-03 NOTE — Telephone Encounter (Signed)
Returned call to patient she stated for the past couple of days she has felt light headed and dizzy.Having frequent palpitations.Stated when she stands up she gets dizzy,has not blacked out.Virtual appointment scheduled with Azalee Course PA this afternoon at 3:00 pm.Patient gave permission for a virtual appointment.

## 2018-10-03 NOTE — Patient Instructions (Signed)
Medication Instructions:   INCREASE MIDODRINE 12PM DOSE TO 10 MG- 10 MG (2 TABLETS) IN THE MORNING 10 MG (2 TABLETS) AT NOON AND 5 MG (1 TABLET)  IN THE EVENINGS  If you need a refill on your cardiac medications before your next appointment, please call your pharmacy.   Lab work:  You will need to have labs (blood work) drawn when you come in for your follow up appointment in 2 weeks:  CBC BMET  If you have labs (blood work) drawn today and your tests are completely normal, you will receive your results only by: Marland Kitchen MyChart Message (if you have MyChart) OR . A paper copy in the mail If you have any lab test that is abnormal or we need to change your treatment, we will call you to review the results.  Testing/Procedures:  NONE ordered at this time   Follow-Up: At [facility], you and your health needs are our priority.  As part of our continuing mission to provide you with exceptional heart care, we have created designated Provider Care Teams.  These Care Teams include your primary Cardiologist (physician) and Advanced Practice Providers (APPs -  Physician Assistants and Nurse Practitioners) who all work together to provide you with the care you need, when you need it.  . Your physician recommends that you schedule a follow-up appointment in 2 weeks   Any Other Special Instructions Will Be Listed Below (If Applicable).

## 2018-10-06 ENCOUNTER — Telehealth: Payer: Self-pay

## 2018-10-06 NOTE — Telephone Encounter (Signed)
    Left detailed message for the patient about getting her scheduled for her 2 week follow up appointment and to call the office back someone will be glad to assist her.

## 2018-10-17 ENCOUNTER — Encounter: Payer: Self-pay | Admitting: Cardiology

## 2018-10-17 ENCOUNTER — Other Ambulatory Visit: Payer: Self-pay

## 2018-10-17 ENCOUNTER — Ambulatory Visit: Payer: BLUE CROSS/BLUE SHIELD | Admitting: Cardiology

## 2018-10-17 VITALS — BP 102/64 | Ht 67.0 in | Wt 137.0 lb

## 2018-10-17 DIAGNOSIS — Z87898 Personal history of other specified conditions: Secondary | ICD-10-CM

## 2018-10-17 DIAGNOSIS — I951 Orthostatic hypotension: Secondary | ICD-10-CM | POA: Diagnosis not present

## 2018-10-17 DIAGNOSIS — R002 Palpitations: Secondary | ICD-10-CM | POA: Diagnosis not present

## 2018-10-17 NOTE — Progress Notes (Signed)
10/17/2018 Diane Hanson   [birth year]  [id number]  Primary Physician Constance GoltzSchoenhoff, Harrington Challengereborah D, MD Primary Cardiologist: Dr Duke Salviaandolph  HPI: Patient is a pleasant 23 year old female who was seen by Dr. Duke Salviaandolph in 2016 after syncopal spell resulting in a concussion.  Echocardiogram and monitor at that time were unremarkable.  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  She has had issues with orthostatic hypotension and has been on Florinef in the past, now Midorine.  She also has a history of palpitations and has been on low dose propanolol.   She had been in [place] working on her masters until the [place] pandemic hit and she moved back home to [place].  She was seen a couple weeks ago for increasing symptoms of orthostatic dizziness.  She describes feeling like everything is going black after she stands up.  Her Middaugh drain was increased to 10 mg in the morning, 10 mg in the afternoon, and 5 mg in the evening.  She returns today for follow up.  She says there has been no change in her symptoms.  She has not had syncope but still feels like everything is "going black" after standing.  There is no associated nausea,  Diaphoresis, or palpitations.   In the office she was orthostatic with a drop in her B/P from 108 systolic after laying for 5 minutes to 88 systolic after standing for two minutes. Her HR remained unchanged -low 50's.    Current Outpatient Medications  Medication Sig Dispense Refill  . doxycycline (VIBRAMYCIN) 100 MG capsule 2 (two) times a day.    . midodrine (PROAMATINE) 5 MG tablet TAKE 2 TABLETS (10 MG) IN THE MORNING, 2 TABLET (10 MG)  MID-DAY, AND 1 TABLET (5 MG)  IN THE EVENING 360 tablet 3  . propranolol (INDERAL) 10 MG tablet Take 1 tablet (10 mg total) by mouth 2 (two) times daily. 180 tablet 3   No current facility-administered medications for this visit.     No Known Allergies  Past Medical History:  Diagnosis Date  . Orthostatic hypotension 07/11/2015  .  Sinus arrhythmia 02/23/2015  . Syncope     Social History   Socioeconomic History  . Marital status: Single    Spouse name: Not on file  . Number of children: Not on file  . Years of education: Not on file  . Highest education level: Not on file  Occupational History  . Not on file  Social Needs  . Financial resource strain: Not on file  . Food insecurity:    Worry: Not on file    Inability: Not on file  . Transportation needs:    Medical: Not on file    Non-medical: Not on file  Tobacco Use  . Smoking status: Never Smoker  . Smokeless tobacco: Never Used  Substance and Sexual Activity  . Alcohol use: No  . Drug use: Not on file  . Sexual activity: Not on file  Lifestyle  . Physical activity:    Days per week: Not on file    Minutes per session: Not on file  . Stress: Not on file  Relationships  . Social connections:    Talks on phone: Not on file    Gets together: Not on file    Attends religious service: Not on file    Active member of club or organization: Not on file    Attends meetings of clubs or organizations: Not on file    Relationship status: Not  on file  . Intimate partner violence:    Fear of current or ex partner: Not on file    Emotionally abused: Not on file    Physically abused: Not on file    Forced sexual activity: Not on file  Other Topics Concern  . Not on file  Social History Narrative  . Not on file     Family History  Family history unknown: Yes     Review of Systems: General: negative for chills, fever, night sweats or weight changes.  Cardiovascular: negative for chest pain, dyspnea on exertion, edema, orthopnea, palpitations, paroxysmal nocturnal dyspnea or shortness of breath Dermatological: negative for rash Respiratory: negative for cough or wheezing Urologic: negative for hematuria Abdominal: negative for nausea, vomiting, diarrhea, bright red blood per rectum, melena, or hematemesis Neurologic: negative for visual changes,  syncope, or dizziness All other systems reviewed and are otherwise negative except as noted above.    Blood pressure 102/64, height 5\' 7"  (1.702 m), weight 137 lb (62.1 kg).  General appearance: alert, cooperative and no distress Neck: no JVD Lungs: clear to auscultation bilaterally Heart: regular rate and rhythm Extremities: no edema Skin: Skin color, texture, turgor normal. No rashes or lesions Neurologic: Grossly normal  EKG [id number]  ASSESSMENT AND PLAN:   Orthostatic hypotension- Not improved on increased Midodrine.  I suspect baseline low B/P and bradycardia contributing.  Palpitations- On propanolol 10 mg BID  H/O syncope and collapse Orthostatic syncope 2016.  Echo and monitor unremarkable then.   PLAN  Wean off Propanolol.  If this doesn't help, or she can't tolerate being off Propanolol she will need a referral to Dr Graciela Husbands for further evaluation.   [name] PA-C 10/17/2018 12:13 PM

## 2018-10-17 NOTE — Patient Instructions (Signed)
Medication Instructions:  DECREASE Propanolol to 1 tablet at bedtime for 3 days THEN 1 tablet every other day for 3 doses THEN STOP medication all together  If you need a refill on your cardiac medications before your next appointment, please call your pharmacy.   Lab work: None  If you have labs (blood work) drawn today and your tests are completely normal, you will receive your results only by: Marland Kitchen MyChart Message (if you have MyChart) OR . A paper copy in the mail If you have any lab test that is abnormal or we need to change your treatment, we will call you to review the results.  Testing/Procedures: None   Follow-Up: At [facility], you and your health needs are our priority.  As part of our continuing mission to provide you with exceptional heart care, we have created designated Provider Care Teams.  These Care Teams include your primary Cardiologist (physician) and Advanced Practice Providers (APPs -  Physician Assistants and Nurse Practitioners) who all work together to provide you with the care you need, when you need it. Marland Kitchen CALL OFFICE WITH AN UPDATE AFTER STOPPING MEDICATION ALTOGETHER . FOLLOW UP IS PENDING HOW YOU DO AFTER STOPPING PROPANOLOL  Any Other Special Instructions Will Be Listed Below (If Applicable).

## 2018-10-21 DIAGNOSIS — R002 Palpitations: Secondary | ICD-10-CM | POA: Diagnosis not present

## 2018-10-21 DIAGNOSIS — I951 Orthostatic hypotension: Secondary | ICD-10-CM | POA: Diagnosis not present

## 2018-10-24 DIAGNOSIS — R42 Dizziness and giddiness: Secondary | ICD-10-CM | POA: Diagnosis not present

## 2018-10-24 DIAGNOSIS — R55 Syncope and collapse: Secondary | ICD-10-CM | POA: Diagnosis not present

## 2018-10-24 DIAGNOSIS — R001 Bradycardia, unspecified: Secondary | ICD-10-CM | POA: Diagnosis not present

## 2018-10-24 DIAGNOSIS — R002 Palpitations: Secondary | ICD-10-CM | POA: Diagnosis not present

## 2018-10-25 DIAGNOSIS — R001 Bradycardia, unspecified: Secondary | ICD-10-CM | POA: Diagnosis not present

## 2018-11-07 DIAGNOSIS — R55 Syncope and collapse: Secondary | ICD-10-CM | POA: Diagnosis not present

## 2018-11-07 DIAGNOSIS — R42 Dizziness and giddiness: Secondary | ICD-10-CM | POA: Diagnosis not present

## 2018-11-07 DIAGNOSIS — R002 Palpitations: Secondary | ICD-10-CM | POA: Diagnosis not present

## 2018-11-07 DIAGNOSIS — R001 Bradycardia, unspecified: Secondary | ICD-10-CM | POA: Diagnosis not present

## 2018-11-11 DIAGNOSIS — L700 Acne vulgaris: Secondary | ICD-10-CM | POA: Diagnosis not present

## 2019-02-02 DIAGNOSIS — L700 Acne vulgaris: Secondary | ICD-10-CM | POA: Diagnosis not present

## 2019-05-31 IMAGING — MR MR ABDOMEN WO/W CM
10 of 17 series · 30 of 48 positions shown · IV contrast (6 ml eovist)
Comparison: Ultrasound exam 05/16/2018

CLINICAL DATA: Liver cyst on ultrasound.

EXAM:
MRI ABDOMEN WITHOUT AND WITH CONTRAST
TECHNIQUE: Multiplanar multisequence MR imaging of the abdomen was performed
both before and after the administration of intravenous contrast.
CONTRAST:  6mL EOVIST GADOXETATE DISODIUM 0.25 MOL/L IV SOLN

[Series 4: bSSFP · axial · 5.0mm · 0.70mm/px · 1 of 44 slices shown]
[im 1/44]
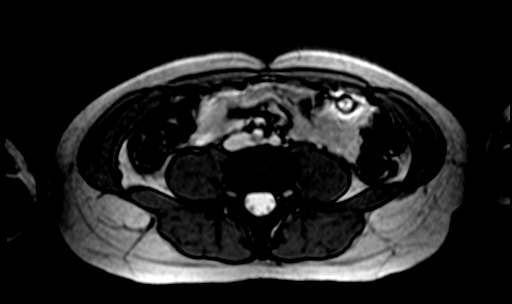

[Series 8: T1 · axial · 6.0mm · 0.70mm/px · z∈[-158,+64]mm · 2 of 64 slices shown]
[im 1/64]
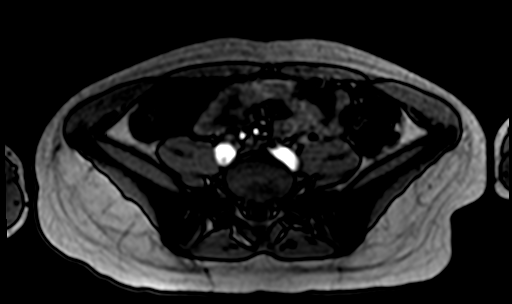
[im 64/64]
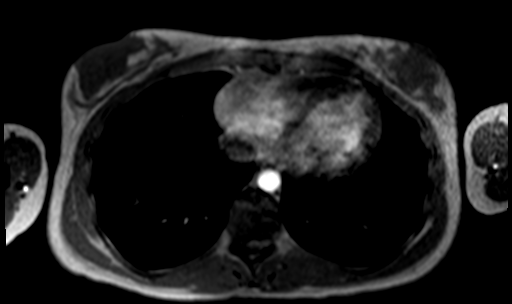

[Series 9: T1 dynamic · axial · non-contrast · 2.5mm · 0.70mm/px · z∈[-156,+61]mm · 3 of 88 slices shown]
[im 1/88]
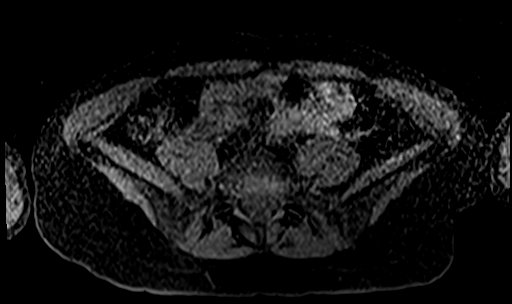
[im 44/88]
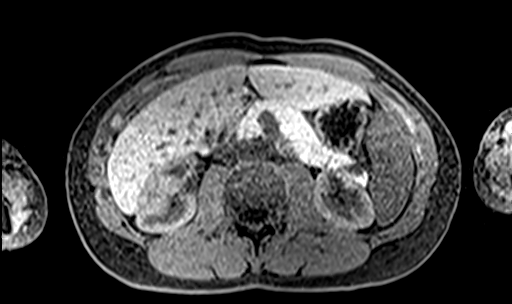
[im 88/88]
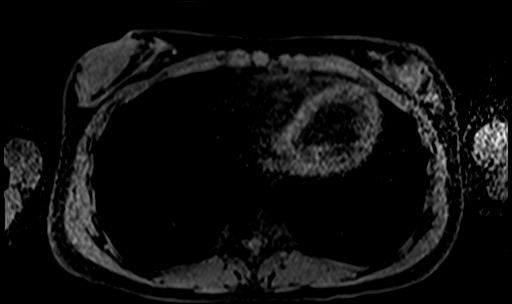

[Series 11: T1 dynamic post-contrast · axial · 2.5mm · 0.70mm/px · z∈[-156,+61]mm · 3 of 88 slices shown (1 of 7)]
[im 1/88]
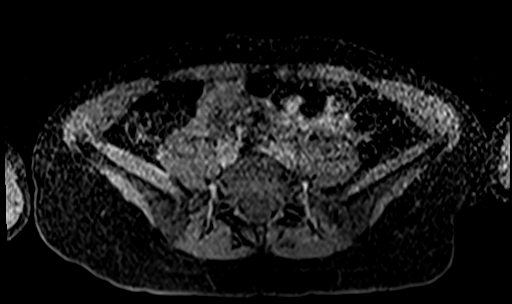
[im 44/88]
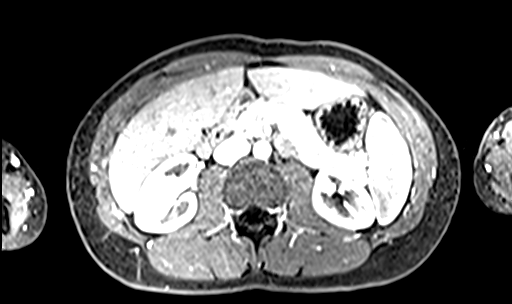
[im 88/88]
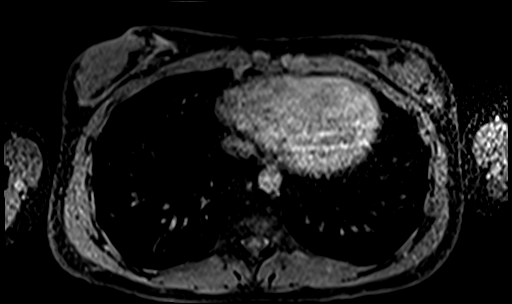

[Series 12: T1 dynamic post-contrast · axial · 2.5mm · 0.70mm/px · z∈[-156,+61]mm · 4 of 88 slices shown (2 of 7)]
[im 1/88]
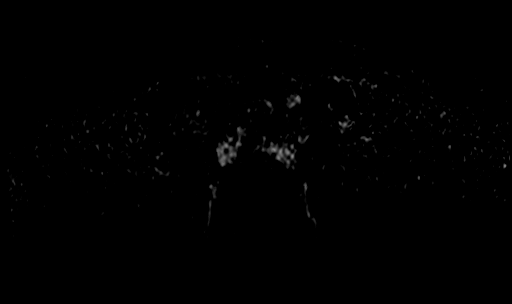
[im 30/88]
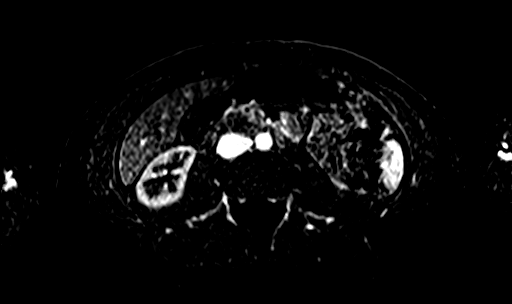
[im 59/88]
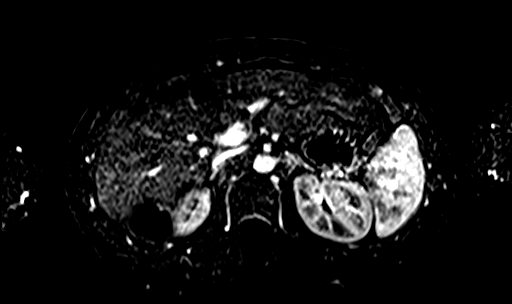
[im 88/88]
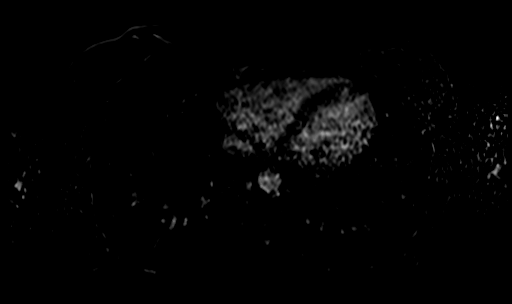

[Series 13: T1 dynamic post-contrast · axial · 2.5mm · 0.70mm/px · z∈[-156,+61]mm · 4 of 88 slices shown (3 of 7)]
[im 1/88]
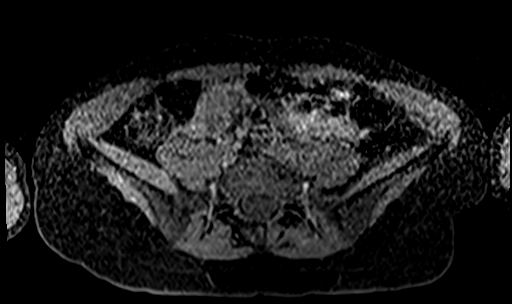
[im 30/88]
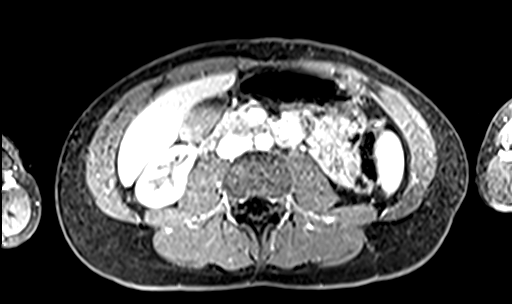
[im 59/88]
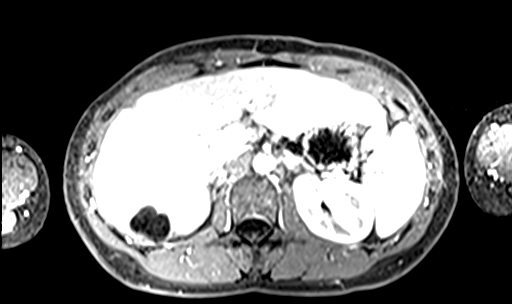
[im 88/88]
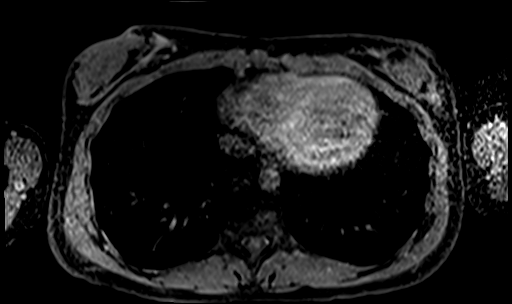

[Series 14: T1 dynamic post-contrast · axial · 2.5mm · 0.70mm/px · z∈[-156,+61]mm · 4 of 88 slices shown (4 of 7)]
[im 1/88]
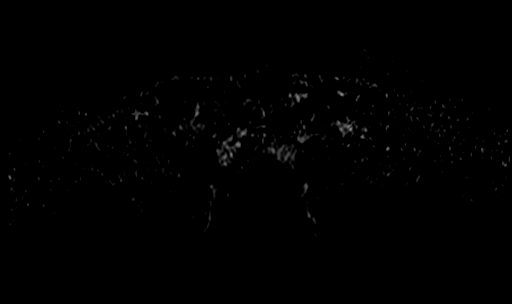
[im 30/88]
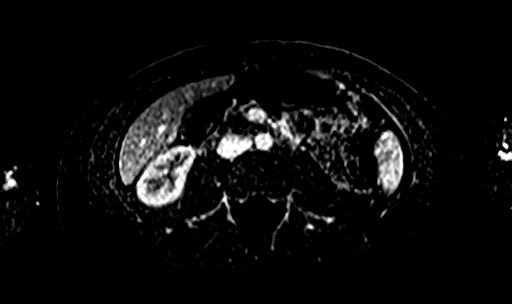
[im 59/88]
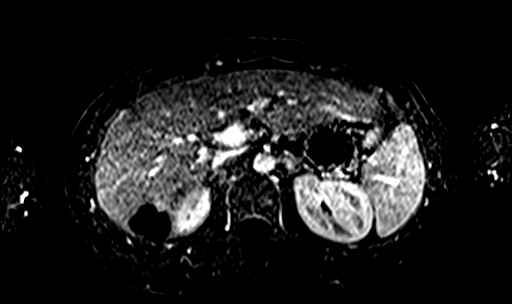
[im 88/88]
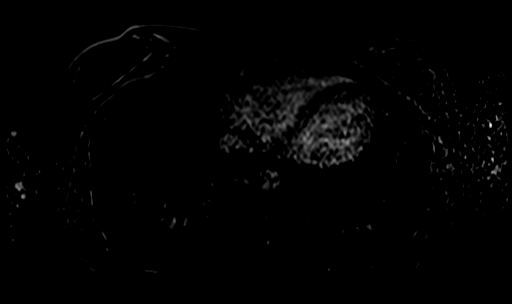

[Series 15: T1 dynamic post-contrast · axial · 2.5mm · 0.70mm/px · z∈[-156,+61]mm · 4 of 88 slices shown (5 of 7)]
[im 1/88]
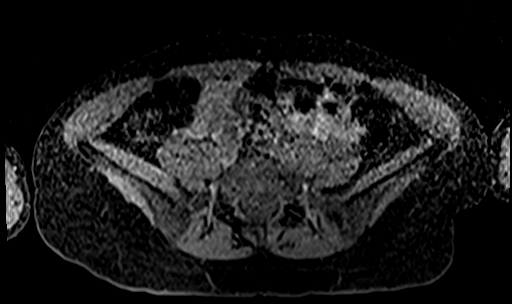
[im 30/88]
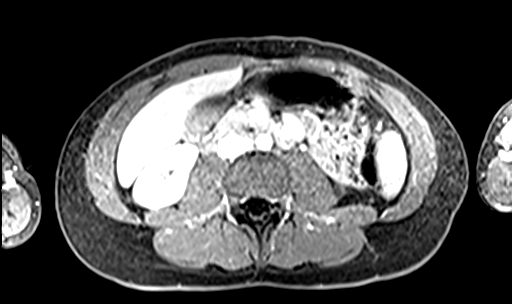
[im 59/88]
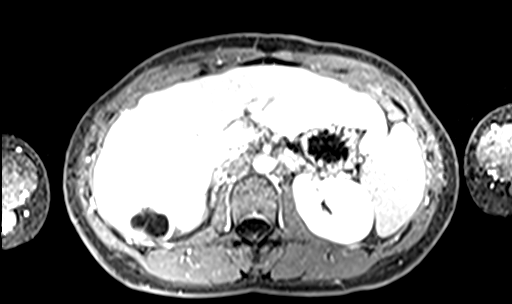
[im 88/88]
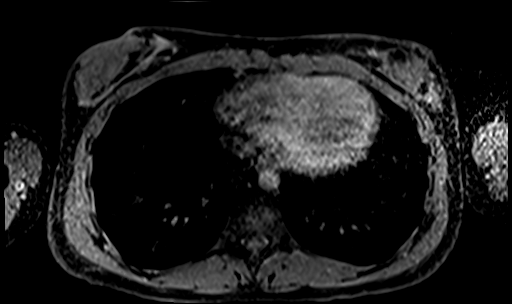

[Series 16: T1 dynamic post-contrast · axial · 2.5mm · 0.70mm/px · z∈[-156,+61]mm · 4 of 88 slices shown (6 of 7)]
[im 1/88]
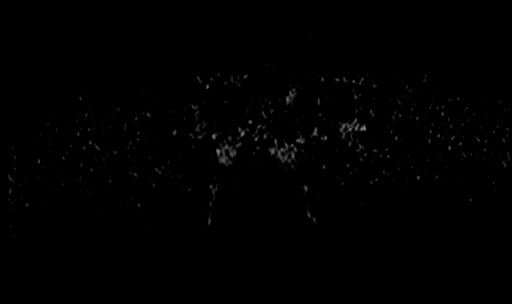
[im 30/88]
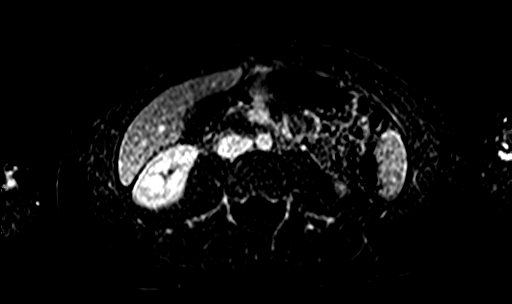
[im 59/88]
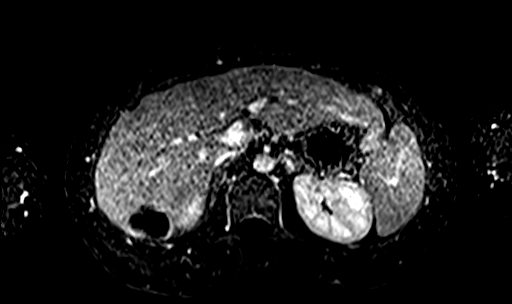
[im 88/88]
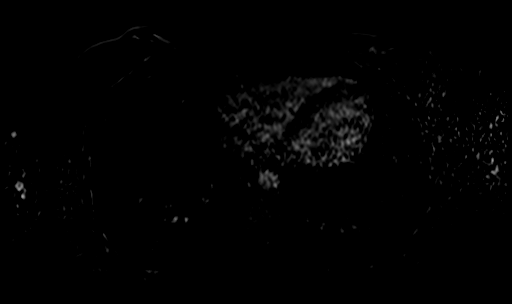

[Series 17: T1 dynamic post-contrast · coronal · 2.5mm · 0.74mm/px · 1 of 72 slices shown (7 of 7)]
[im 1/72]
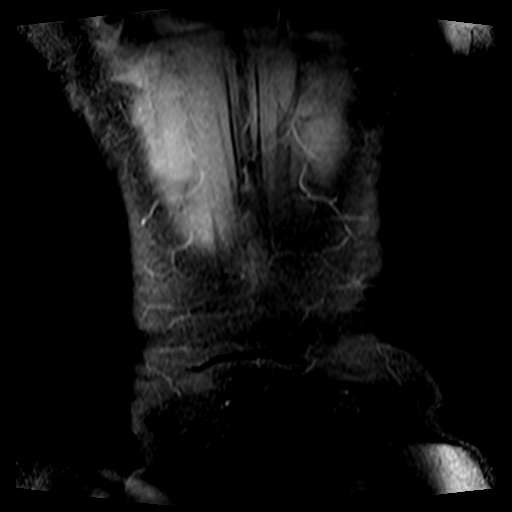

[30 of 48 positions shown; findings below may reference images not displayed]

FINDINGS: Lower chest: Unremarkable.

Hepatobiliary: 3 cm simple cyst identified posterior right liver
contains a thin, smooth, partial septation. Another 6 mm cyst is
identified in the inferior aspect of the medial segment left liver.
Liver parenchyma otherwise unremarkable. There is no evidence for
gallstones, gallbladder wall thickening, or pericholecystic fluid.
No intrahepatic or extrahepatic biliary dilation.

Pancreas: No focal mass lesion. No dilatation of the main duct. No
intraparenchymal cyst. No peripancreatic edema.

Spleen:  No splenomegaly. No focal mass lesion.

Adrenals/Urinary Tract: No adrenal nodule or mass. Kidneys
unremarkable.

Stomach/Bowel: Stomach is nondistended. No gastric wall thickening.
No evidence of outlet obstruction. Duodenum is normally positioned
as is the ligament of Treitz. No small bowel or colonic dilatation
within the visualized abdomen.

Vascular/Lymphatic: No abdominal aortic aneurysm. No abdominal
lymphadenopathy

Other:  No intraperitoneal free fluid.

Musculoskeletal: No abnormal marrow enhancement within the
visualized bony anatomy.
IMPRESSION: 1. Benign cysts identified in the posterior right liver and inferior
left liver as noted on recent ultrasound.
2. Otherwise unremarkable exam.

## 2019-11-07 DIAGNOSIS — R350 Frequency of micturition: Secondary | ICD-10-CM | POA: Diagnosis not present

## 2019-11-07 DIAGNOSIS — F419 Anxiety disorder, unspecified: Secondary | ICD-10-CM | POA: Diagnosis not present

## 2019-11-10 DIAGNOSIS — Z0000 Encounter for general adult medical examination without abnormal findings: Secondary | ICD-10-CM | POA: Diagnosis not present

## 2019-11-11 DIAGNOSIS — R300 Dysuria: Secondary | ICD-10-CM | POA: Diagnosis not present

## 2019-11-19 DIAGNOSIS — F411 Generalized anxiety disorder: Secondary | ICD-10-CM | POA: Diagnosis not present

## 2019-11-24 DIAGNOSIS — F411 Generalized anxiety disorder: Secondary | ICD-10-CM | POA: Diagnosis not present

## 2019-11-24 DIAGNOSIS — F330 Major depressive disorder, recurrent, mild: Secondary | ICD-10-CM | POA: Diagnosis not present

## 2019-11-24 DIAGNOSIS — F428 Other obsessive-compulsive disorder: Secondary | ICD-10-CM | POA: Diagnosis not present

## 2019-11-25 DIAGNOSIS — F411 Generalized anxiety disorder: Secondary | ICD-10-CM | POA: Diagnosis not present

## 2019-12-10 DIAGNOSIS — F411 Generalized anxiety disorder: Secondary | ICD-10-CM | POA: Diagnosis not present

## 2019-12-17 DIAGNOSIS — F428 Other obsessive-compulsive disorder: Secondary | ICD-10-CM | POA: Diagnosis not present

## 2019-12-17 DIAGNOSIS — F411 Generalized anxiety disorder: Secondary | ICD-10-CM | POA: Diagnosis not present

## 2019-12-17 DIAGNOSIS — F330 Major depressive disorder, recurrent, mild: Secondary | ICD-10-CM | POA: Diagnosis not present

## 2019-12-28 DIAGNOSIS — F411 Generalized anxiety disorder: Secondary | ICD-10-CM | POA: Diagnosis not present

## 2020-01-06 DIAGNOSIS — F411 Generalized anxiety disorder: Secondary | ICD-10-CM | POA: Diagnosis not present

## 2020-02-05 DIAGNOSIS — F419 Anxiety disorder, unspecified: Secondary | ICD-10-CM | POA: Diagnosis not present

## 2020-06-09 ENCOUNTER — Encounter: Payer: Self-pay | Admitting: Cardiovascular Disease
